# Patient Record
Sex: Female | Born: 1945
Health system: Southern US, Community
[De-identification: ages and names within clinical notes are randomized; demographics above are authoritative.]

## PROBLEM LIST (undated history)

## (undated) DIAGNOSIS — E039 Hypothyroidism, unspecified: Secondary | ICD-10-CM

## (undated) HISTORY — PX: BREAST BIOPSY: SHX20

## (undated) HISTORY — DX: Hypothyroidism, unspecified: E03.9

---

## 1970-06-23 HISTORY — PX: BREAST BIOPSY: SHX20

## 2010-06-23 HISTORY — PX: CATARACT EXTRACTION: SUR2

## 2013-09-08 ENCOUNTER — Ambulatory Visit: Payer: Self-pay | Admitting: Family Medicine

## 2015-05-28 ENCOUNTER — Other Ambulatory Visit: Payer: Self-pay | Admitting: Family Medicine

## 2015-05-28 DIAGNOSIS — Z1231 Encounter for screening mammogram for malignant neoplasm of breast: Secondary | ICD-10-CM

## 2015-06-06 ENCOUNTER — Ambulatory Visit
Admission: RE | Admit: 2015-06-06 | Discharge: 2015-06-06 | Disposition: A | Discharge status: Home / Self Care | Payer: Medicare Other | Source: Ambulatory Visit | Attending: Family Medicine | Admitting: Family Medicine

## 2015-06-06 DIAGNOSIS — Z1231 Encounter for screening mammogram for malignant neoplasm of breast: Secondary | ICD-10-CM | POA: Diagnosis not present

## 2016-06-25 ENCOUNTER — Other Ambulatory Visit: Payer: Self-pay | Admitting: Family Medicine

## 2016-06-25 DIAGNOSIS — Z1231 Encounter for screening mammogram for malignant neoplasm of breast: Secondary | ICD-10-CM

## 2016-07-01 ENCOUNTER — Ambulatory Visit
Admission: RE | Admit: 2016-07-01 | Discharge: 2016-07-01 | Disposition: A | Discharge status: Home / Self Care | Payer: Medicare HMO | Source: Ambulatory Visit | Attending: Family Medicine | Admitting: Family Medicine

## 2016-07-01 DIAGNOSIS — Z1231 Encounter for screening mammogram for malignant neoplasm of breast: Secondary | ICD-10-CM | POA: Insufficient documentation

## 2016-07-26 ENCOUNTER — Ambulatory Visit
Admission: EM | Admit: 2016-07-26 | Discharge: 2016-07-26 | Disposition: A | Discharge status: Home / Self Care | Payer: Medicare HMO | Attending: Family Medicine | Admitting: Family Medicine

## 2016-07-26 ENCOUNTER — Encounter: Payer: Self-pay | Admitting: Gynecology

## 2016-07-26 DIAGNOSIS — J029 Acute pharyngitis, unspecified: Secondary | ICD-10-CM

## 2016-07-26 LAB — RAPID STREP SCREEN (MED CTR MEBANE ONLY): Streptococcus, Group A Screen (Direct): NEGATIVE

## 2016-07-26 MED ORDER — AZITHROMYCIN 250 MG PO TABS: 250.0000 mg | ORAL_TABLET | Freq: Every day | ORAL | 0 refills | Status: DC

## 2016-07-26 NOTE — ED Provider Notes (Signed)
[facility]    CSN: [id number] Arrival date & time: 07/26/16  1011  History   Chief Complaint Chief Complaint  Patient presents with  . Sore Throat  . Fever   HPI  71 year old female presents with complaints of sore throat and fever. Symptoms started Monday. Mild fever. Tmax 100.9. Sore throat is severe per her report. Associated mild cough. No other associated symptoms. She's been using ibuprofen with no improvement. No known exacerbating factors. No other associated symptoms. No other complaints or concerns at this time.  PMH - Hypothyroidism.  Past Surgical History:  Procedure Laterality Date  . BREAST BIOPSY Left    neg   OB History    No data available     Home Medications    Prior to Admission medications   Medication Sig Start Date End Date Taking? Authorizing Provider  levothyroxine (SYNTHROID, LEVOTHROID) 75 MCG tablet Take 75 mcg by mouth daily before breakfast.   Yes Historical Provider, MD  azithromycin (ZITHROMAX) 250 MG tablet Take 1 tablet (250 mg total) by mouth daily. Take first 2 tablets together, then 1 every day until finished. 07/26/16   Tommie SamsJayce G Julicia Krieger, DO   Family History Family History  Problem Relation Age of Onset  . Breast cancer Neg Hx    Social History Social History  Substance Use Topics  . Smoking status: Never Smoker  . Smokeless tobacco: Never Used  . Alcohol use No   Allergies   Patient has no known allergies.  Review of Systems Review of Systems  Constitutional: Positive for fever.  HENT: Positive for sore throat.   Respiratory: Positive for cough.   All other systems reviewed and are negative.  Physical Exam Triage Vital Signs ED Triage Vitals  Enc Vitals Group     BP 07/26/16 1118 99/84     Pulse Rate 07/26/16 1118 77     Resp 07/26/16 1118 16     Temp 07/26/16 1118 99.4 F (37.4 C)     Temp Source 07/26/16 1118 Oral     SpO2 07/26/16 1118 100 %     Weight 07/26/16 1116 165 lb (74.8 kg)     Height 07/26/16  1116 5\' 7"  (1.702 m)     Head Circumference --      Peak Flow --      Pain Score 07/26/16 1120 5     Pain Loc --      Pain Edu? --      Excl. in GC? --    Updated Vital Signs BP 99/84 (BP Location: Left Arm)   Pulse 77   Temp 99.4 F (37.4 C) (Oral)   Resp 16   Ht 5\' 7"  (1.702 m)   Wt 165 lb (74.8 kg)   SpO2 100%   BMI 25.84 kg/m   Physical Exam  Constitutional: She is oriented to person, place, and time. She appears well-developed. No distress.  HENT:  Mouth/Throat: Oropharynx is clear and moist.  Eyes: Conjunctivae are normal.  Neck: Neck supple.  Cardiovascular: Normal rate and regular rhythm.   Pulmonary/Chest: Effort normal and breath sounds normal.  Abdominal: Soft. She exhibits no distension. There is no tenderness.  Musculoskeletal: Normal range of motion.  Lymphadenopathy:    She has no cervical adenopathy.  Neurological: She is alert and oriented to person, place, and time.  Skin: No rash noted.  Psychiatric: She has a normal mood and affect.  Vitals reviewed.  UC Treatments / Results  Labs (all labs ordered are  listed, but only abnormal results are displayed) Labs Reviewed  RAPID STREP SCREEN (NOT AT [facility])  CULTURE, GROUP A STREP [facility])    EKG  EKG Interpretation None      Radiology No results found.  Procedures Procedures (including critical care time)  Medications Ordered in UC Medications - No data to display   Initial Impression / Assessment and Plan / UC Course  I have reviewed the triage vital signs and the nursing notes.  Pertinent labs & imaging results that were available during my care of the patient were reviewed by me and considered in my medical decision making (see chart for details).    71 year old female presents with complaints of sore throat and fever. Rapid strep negative. Appears well. Patient reporting severe sore throat. Rx given for azithromycin to cover atypicals. This is to be filled if she fails to improve or  worsens. She understands iand is in agreement. Advised supportive care.  Final Clinical Impressions(s) / UC Diagnoses   Final diagnoses:  Acute pharyngitis, unspecified etiology   New Prescriptions Discharge Medication List as of 07/26/2016 12:20 PM    START taking these medications   Details  azithromycin (ZITHROMAX) 250 MG tablet Take 1 tablet (250 mg total) by mouth daily. Take first 2 tablets together, then 1 every day until finished., Starting Sat 07/26/2016, Print         Verdis Frederickson Reynolds, DO 07/26/16 1231

## 2016-07-26 NOTE — ED Triage Notes (Signed)
Per patient sore throat x 6 days and fever last night of 100.9

## 2016-07-29 LAB — CULTURE, GROUP A STREP (THRC)

## 2017-07-24 ENCOUNTER — Other Ambulatory Visit: Payer: Self-pay | Admitting: Family Medicine

## 2017-07-24 DIAGNOSIS — Z1231 Encounter for screening mammogram for malignant neoplasm of breast: Secondary | ICD-10-CM

## 2017-07-29 ENCOUNTER — Ambulatory Visit
Admission: RE | Admit: 2017-07-29 | Discharge: 2017-07-29 | Disposition: A | Discharge status: Home / Self Care | Payer: Medicare HMO | Source: Ambulatory Visit | Attending: Family Medicine | Admitting: Family Medicine

## 2017-07-29 DIAGNOSIS — Z1231 Encounter for screening mammogram for malignant neoplasm of breast: Secondary | ICD-10-CM | POA: Diagnosis not present

## 2018-07-30 ENCOUNTER — Other Ambulatory Visit: Payer: Self-pay | Admitting: Family Medicine

## 2018-07-30 DIAGNOSIS — Z1231 Encounter for screening mammogram for malignant neoplasm of breast: Secondary | ICD-10-CM

## 2018-08-11 ENCOUNTER — Encounter (INDEPENDENT_AMBULATORY_CARE_PROVIDER_SITE_OTHER): Payer: Self-pay

## 2018-08-11 ENCOUNTER — Ambulatory Visit
Admission: RE | Admit: 2018-08-11 | Discharge: 2018-08-11 | Disposition: A | Discharge status: Home / Self Care | Payer: Medicare HMO | Source: Ambulatory Visit | Attending: Family Medicine | Admitting: Family Medicine

## 2018-08-11 DIAGNOSIS — Z1231 Encounter for screening mammogram for malignant neoplasm of breast: Secondary | ICD-10-CM | POA: Insufficient documentation

## 2019-07-26 LAB — LIPID PANEL
Cholesterol: 243 — AB (ref 0–200)
HDL: 52 (ref 35–70)
LDL Cholesterol: 151
Triglycerides: 202 — AB (ref 40–160)

## 2019-07-26 LAB — CBC: RBC: 4.7 (ref 3.87–5.11)

## 2019-07-26 LAB — HEPATIC FUNCTION PANEL
ALT: 18 (ref 7–35)
AST: 20 (ref 13–35)
Alkaline Phosphatase: 126 — AB (ref 25–125)
Bilirubin, Total: 0.6

## 2019-07-26 LAB — CBC AND DIFFERENTIAL
HCT: 43 (ref 36–46)
Hemoglobin: 14 (ref 12.0–16.0)
Platelets: 303 (ref 150–399)
WBC: 6.2

## 2019-07-26 LAB — BASIC METABOLIC PANEL
BUN: 14 (ref 4–21)
CO2: 22 (ref 13–22)
Chloride: 107 (ref 99–108)
Creatinine: 1 (ref 0.5–1.1)
Glucose: 89
Potassium: 4 (ref 3.4–5.3)
Sodium: 141 (ref 137–147)

## 2019-07-26 LAB — COMPREHENSIVE METABOLIC PANEL
Albumin: 3.6 (ref 3.5–5.0)
Calcium: 9.3 (ref 8.7–10.7)
GFR calc non Af Amer: 56

## 2019-07-26 LAB — TSH: TSH: 1.44 (ref 0.41–5.90)

## 2019-07-26 LAB — HEMOGLOBIN A1C: Hemoglobin A1C: 5.5

## 2020-06-04 ENCOUNTER — Other Ambulatory Visit: Payer: Self-pay | Admitting: Internal Medicine

## 2020-06-05 ENCOUNTER — Ambulatory Visit: Payer: Medicare HMO | Admitting: Internal Medicine

## 2020-06-05 ENCOUNTER — Encounter: Payer: Self-pay | Admitting: Internal Medicine

## 2020-06-05 ENCOUNTER — Other Ambulatory Visit: Payer: Self-pay

## 2020-06-05 VITALS — BP 138/80 | HR 80 | Temp 98.5°F | Ht 67.0 in | Wt 174.0 lb

## 2020-06-05 DIAGNOSIS — Z1231 Encounter for screening mammogram for malignant neoplasm of breast: Secondary | ICD-10-CM | POA: Diagnosis not present

## 2020-06-05 DIAGNOSIS — E039 Hypothyroidism, unspecified: Secondary | ICD-10-CM | POA: Diagnosis not present

## 2020-06-05 MED ORDER — LEVOTHYROXINE SODIUM 75 MCG PO TABS: 75.0000 ug | ORAL_TABLET | Freq: Every day | ORAL | 1 refills | Status: DC

## 2020-06-05 NOTE — Progress Notes (Signed)
Date:  06/05/2020   Name:  Krista Burns   DOB:  09-Apr-1946   MRN:  [id number]  Previous patient of [facility] [place]. Colonoscopy done in [facility]  03/2011 normal.  Chief Complaint: Establish Care  Thyroid Problem Presents for follow-up visit. Patient reports no anxiety, constipation, depressed mood, diarrhea, fatigue, heat intolerance, leg swelling, palpitations, weight gain or weight loss. The symptoms have been stable.    Lab Results  Component Value Date   CREATININE 1.0 07/26/2019   BUN 14 07/26/2019   NA 141 07/26/2019   K 4.0 07/26/2019   CL 107 07/26/2019   CO2 22 07/26/2019   Lab Results  Component Value Date   CHOL 243 (A) 07/26/2019   HDL 52 07/26/2019   LDLCALC 151 07/26/2019   TRIG 202 (A) 07/26/2019   Lab Results  Component Value Date   TSH 1.44 07/26/2019   Lab Results  Component Value Date   HGBA1C 5.5 07/26/2019   Lab Results  Component Value Date   WBC 6.2 07/26/2019   HGB 14.0 07/26/2019   HCT 43 07/26/2019   PLT 303 07/26/2019   Lab Results  Component Value Date   ALT 18 07/26/2019   AST 20 07/26/2019   ALKPHOS 126 (A) 07/26/2019     Review of Systems  Constitutional: Negative for chills, fatigue, fever, unexpected weight change, weight gain and weight loss.  HENT: Negative for trouble swallowing.   Respiratory: Negative for chest tightness and shortness of breath.   Cardiovascular: Negative for chest pain, palpitations and leg swelling.  Gastrointestinal: Negative for abdominal pain, constipation and diarrhea.  Endocrine: Negative for heat intolerance.  Musculoskeletal: Negative for arthralgias.  Neurological: Negative for dizziness, light-headedness and headaches.  Psychiatric/Behavioral: Negative for dysphoric mood and sleep disturbance. The patient is not nervous/anxious.     Patient Active Problem List   Diagnosis Date Noted  . Hypothyroidism (acquired) 06/05/2020    No Known Allergies  Past Surgical  History:  Procedure Laterality Date  . breast excision Left 1972   clogged milk duct    Social History   Tobacco Use  . Smoking status: Never Smoker  . Smokeless tobacco: Never Used  Vaping Use  . Vaping Use: Never used  Substance Use Topics  . Alcohol use: No  . Drug use: Not Currently     Medication list has been reviewed and updated.  Current Meds  Medication Sig  . levothyroxine (SYNTHROID, LEVOTHROID) 75 MCG tablet Take 75 mcg by mouth daily before breakfast.    PHQ 2/9 Scores 06/05/2020  PHQ - 2 Score 0  PHQ- 9 Score 0    GAD 7 : Generalized Anxiety Score 06/05/2020  Nervous, Anxious, on Edge 0  Control/stop worrying 0  Worry too much - different things 0  Trouble relaxing 0  Restless 0  Easily annoyed or irritable 0  Afraid - awful might happen 0  Total GAD 7 Score 0    BP Readings from Last 3 Encounters:  06/05/20 138/80  07/26/16 99/84    Physical Exam Vitals and nursing note reviewed.  Constitutional:      General: She is not in acute distress.    Appearance: Normal appearance. She is well-developed.  HENT:     Head: Normocephalic and atraumatic.  Neck:     Vascular: No carotid bruit.  Cardiovascular:     Rate and Rhythm: Normal rate and regular rhythm.     Pulses: Normal pulses.     Heart  sounds: No murmur heard.   Pulmonary:     Effort: Pulmonary effort is normal. No respiratory distress.     Breath sounds: No wheezing or rhonchi.  Musculoskeletal:        General: Normal range of motion.     Cervical back: Normal range of motion.     Right lower leg: No edema.     Left lower leg: No edema.  Lymphadenopathy:     Cervical: No cervical adenopathy.  Skin:    General: Skin is warm and dry.     Findings: No rash.  Neurological:     General: No focal deficit present.     Mental Status: She is alert and oriented to person, place, and time.  Psychiatric:        Mood and Affect: Mood and affect and mood normal.     Wt Readings from  Last 3 Encounters:  06/05/20 174 lb (78.9 kg)  07/26/16 165 lb (74.8 kg)    BP 138/80   Pulse 80   Temp 98.5 F (36.9 C) (Oral)   Ht 5\' 7"  (1.702 m)   Wt 174 lb (78.9 kg)   SpO2 99%   BMI 27.25 kg/m   Assessment and Plan: 1. Hypothyroidism (acquired) Supplemented; last TSH normal Patient is without complaints today Continue current regimen - levothyroxine (SYNTHROID) 75 MCG tablet; Take 1 tablet (75 mcg total) by mouth daily before breakfast.  Dispense: 90 tablet; Refill: 1  2. Encounter for screening mammogram for breast cancer Schedule at [facility] - MM 3D SCREEN BREAST BILATERAL; Future   Partially dictated using [facility]. Any errors are unintentional.  Animal nutritionist, MD [facility]  06/05/2020

## 2020-07-16 DIAGNOSIS — Z961 Presence of intraocular lens: Secondary | ICD-10-CM | POA: Diagnosis not present

## 2020-07-16 DIAGNOSIS — H43813 Vitreous degeneration, bilateral: Secondary | ICD-10-CM | POA: Diagnosis not present

## 2020-07-16 DIAGNOSIS — H25811 Combined forms of age-related cataract, right eye: Secondary | ICD-10-CM | POA: Diagnosis not present

## 2020-08-09 ENCOUNTER — Telehealth: Payer: Self-pay | Admitting: Internal Medicine

## 2020-08-09 NOTE — Telephone Encounter (Signed)
Left message for patient to call back and schedule Medicare Annual Wellness Visit (AWV) either virtually or in office. Whichever the patients preference is.  No history of AWV; please schedule at anytime with [facility] Health Advisor.  This should be a 40 minute visit  AWV-I PER [facility] AS OF 07/25/2011

## 2020-08-15 ENCOUNTER — Ambulatory Visit
Admission: RE | Admit: 2020-08-15 | Discharge: 2020-08-15 | Disposition: A | Discharge status: Home / Self Care | Payer: Medicare HMO | Source: Ambulatory Visit | Attending: Internal Medicine | Admitting: Internal Medicine

## 2020-08-15 ENCOUNTER — Other Ambulatory Visit: Payer: Self-pay

## 2020-08-15 DIAGNOSIS — Z1231 Encounter for screening mammogram for malignant neoplasm of breast: Secondary | ICD-10-CM | POA: Insufficient documentation

## 2020-09-12 ENCOUNTER — Ambulatory Visit (INDEPENDENT_AMBULATORY_CARE_PROVIDER_SITE_OTHER): Payer: Medicare HMO | Admitting: Internal Medicine

## 2020-09-12 ENCOUNTER — Encounter: Payer: Self-pay | Admitting: Internal Medicine

## 2020-09-12 ENCOUNTER — Other Ambulatory Visit: Payer: Self-pay

## 2020-09-12 VITALS — BP 138/74 | HR 77 | Temp 97.9°F | Ht 67.0 in | Wt 172.0 lb

## 2020-09-12 DIAGNOSIS — H25091 Other age-related incipient cataract, right eye: Secondary | ICD-10-CM

## 2020-09-12 DIAGNOSIS — Z1159 Encounter for screening for other viral diseases: Secondary | ICD-10-CM

## 2020-09-12 DIAGNOSIS — Z1211 Encounter for screening for malignant neoplasm of colon: Secondary | ICD-10-CM | POA: Diagnosis not present

## 2020-09-12 DIAGNOSIS — E2839 Other primary ovarian failure: Secondary | ICD-10-CM

## 2020-09-12 DIAGNOSIS — H9313 Tinnitus, bilateral: Secondary | ICD-10-CM | POA: Diagnosis not present

## 2020-09-12 DIAGNOSIS — E039 Hypothyroidism, unspecified: Secondary | ICD-10-CM | POA: Diagnosis not present

## 2020-09-12 DIAGNOSIS — Z0000 Encounter for general adult medical examination without abnormal findings: Secondary | ICD-10-CM | POA: Diagnosis not present

## 2020-09-12 NOTE — Progress Notes (Signed)
Date:  09/12/2020   Name:  Krista Burns   DOB:  [birth year]   MRN:  [id number]   Chief Complaint: Annual Exam (Breast exam no pap )  Krista Burns is a 75 y.o. female who presents today for her Complete Annual Exam. She feels well. She reports exercising walking X5-6 days a week. She reports she is sleeping well. Breast complaints none.  She has had itching of palms and soles since her covid booster in November - it is gradually improving.  Mammogram: 07/2020 DEXA: 2012 osteopenia Pap smear: aged out Colonoscopy: 03/2011 - due for repeat  Immunization History  Administered Date(s) Administered  . Influenza, High Dose Seasonal PF 07/11/2018  . PFIZER(Purple Top)SARS-COV-2 Vaccination 08/02/2019, 08/23/2019, 04/27/2020  . Pneumococcal Conjugate-13 11/20/2017  . Pneumococcal Polysaccharide-23 04/24/2011    Thyroid Problem Presents for follow-up visit. Patient reports no anxiety, cold intolerance, constipation, diaphoresis, diarrhea, dry skin, fatigue, hair loss, leg swelling, palpitations, tremors or weight loss. The symptoms have been stable.    Lab Results  Component Value Date   CREATININE 1.0 07/26/2019   BUN 14 07/26/2019   NA 141 07/26/2019   K 4.0 07/26/2019   CL 107 07/26/2019   CO2 22 07/26/2019   Lab Results  Component Value Date   CHOL 243 (A) 07/26/2019   HDL 52 07/26/2019   LDLCALC 151 07/26/2019   TRIG 202 (A) 07/26/2019   Lab Results  Component Value Date   TSH 1.44 07/26/2019   Lab Results  Component Value Date   HGBA1C 5.5 07/26/2019   Lab Results  Component Value Date   WBC 6.2 07/26/2019   HGB 14.0 07/26/2019   HCT 43 07/26/2019   PLT 303 07/26/2019   Lab Results  Component Value Date   ALT 18 07/26/2019   AST 20 07/26/2019   ALKPHOS 126 (A) 07/26/2019     Review of Systems  Constitutional: Negative for chills, diaphoresis, fatigue, fever and weight loss.  HENT: Positive for tinnitus (mild, chronic). Negative for congestion,  hearing loss, trouble swallowing and voice change.   Eyes: Positive for visual disturbance (has right cataract that needs surgery).  Respiratory: Negative for cough, chest tightness, shortness of breath and wheezing.   Cardiovascular: Negative for chest pain, palpitations and leg swelling.  Gastrointestinal: Negative for abdominal pain, constipation, diarrhea and vomiting.  Endocrine: Negative for cold intolerance, polydipsia and polyuria.  Genitourinary: Negative for dysuria, frequency, genital sores, vaginal bleeding and vaginal discharge.  Musculoskeletal: Negative for arthralgias, gait problem and joint swelling.  Skin: Negative for color change and rash.       Itching palms and soles  Neurological: Negative for dizziness, tremors, light-headedness and headaches.  Hematological: Negative for adenopathy. Does not bruise/bleed easily.  Psychiatric/Behavioral: Negative for dysphoric mood and sleep disturbance. The patient is not nervous/anxious.     Patient Active Problem List   Diagnosis Date Noted  . Hypothyroidism (acquired) 06/05/2020    No Known Allergies  Past Surgical History:  Procedure Laterality Date  . BREAST BIOPSY Left 1972   clogged milk duct-neg  . breast excision Left 1972   clogged milk duct    Social History   Tobacco Use  . Smoking status: Never Smoker  . Smokeless tobacco: Never Used  Vaping Use  . Vaping Use: Never used  Substance Use Topics  . Alcohol use: No  . Drug use: Not Currently     Medication list has been reviewed and updated.  Current Meds  Medication  Sig  . levothyroxine (SYNTHROID) 75 MCG tablet Take 1 tablet (75 mcg total) by mouth daily before breakfast.    PHQ 2/9 Scores 09/12/2020 06/05/2020  PHQ - 2 Score 0 0  PHQ- 9 Score 0 0    GAD 7 : Generalized Anxiety Score 09/12/2020 06/05/2020  Nervous, Anxious, on Edge 0 0  Control/stop worrying 0 0  Worry too much - different things 0 0  Trouble relaxing 0 0  Restless 0 0   Easily annoyed or irritable 0 0  Afraid - awful might happen 0 0  Total GAD 7 Score 0 0    BP Readings from Last 3 Encounters:  09/12/20 138/74  06/05/20 138/80  07/26/16 99/84    Physical Exam Vitals and nursing note reviewed.  Constitutional:      General: She is not in acute distress.    Appearance: She is well-developed.  HENT:     Head: Normocephalic and atraumatic.     Right Ear: Tympanic membrane and ear canal normal.     Left Ear: Tympanic membrane and ear canal normal.     Nose:     Right Sinus: No maxillary sinus tenderness.     Left Sinus: No maxillary sinus tenderness.  Eyes:     General: No scleral icterus.       Right eye: No discharge.        Left eye: No discharge.     Conjunctiva/sclera: Conjunctivae normal.  Neck:     Thyroid: No thyromegaly.     Vascular: No carotid bruit.  Cardiovascular:     Rate and Rhythm: Normal rate and regular rhythm.     Pulses: Normal pulses.     Heart sounds: Normal heart sounds.  Pulmonary:     Effort: Pulmonary effort is normal. No respiratory distress.     Breath sounds: No wheezing.  Abdominal:     General: Bowel sounds are normal.     Palpations: Abdomen is soft.     Tenderness: There is no abdominal tenderness.  Musculoskeletal:     Cervical back: Normal range of motion. No erythema.     Right lower leg: No edema.     Left lower leg: No edema.  Lymphadenopathy:     Cervical: No cervical adenopathy.  Skin:    General: Skin is warm and dry.     Findings: No erythema, petechiae or rash.  Neurological:     Mental Status: She is alert and oriented to person, place, and time.     Cranial Nerves: No cranial nerve deficit.     Sensory: No sensory deficit.     Deep Tendon Reflexes: Reflexes are normal and symmetric.  Psychiatric:        Attention and Perception: Attention normal.        Mood and Affect: Mood normal.     Wt Readings from Last 3 Encounters:  09/12/20 172 lb (78 kg)  06/05/20 174 lb (78.9 kg)   07/26/16 165 lb (74.8 kg)    BP 138/74   Pulse 77   Temp 97.9 F (36.6 C) (Oral)   Ht 5\' 7"  (1.702 m)   Wt 172 lb (78 kg)   SpO2 99%   BMI 26.94 kg/m   Assessment and Plan: 1. Annual physical exam Normal exam; continue healthy diet, exercise - CBC with Differential/Platelet - Comprehensive metabolic panel - Lipid panel  2. Colon cancer screening Refer next year for  10 yr follow up  3. Need for hepatitis C  screening test - Hepatitis C antibody  4. Hypothyroidism (acquired) supplemented - TSH + free T4  5. Ovarian failure Due for DEXA - DG Bone Density; Future  6. Tinnitus aurium, bilateral Without hearing loss  7. Other age-related incipient cataract of right eye Surgery upcoming at [facility] She may need to call for a referral per [facility] - she will let us know   Partially dictated using Animal nutritionist. Any errors are unintentional.  Bari Edward, MD [facility]  09/12/2020

## 2020-09-13 ENCOUNTER — Encounter: Payer: Self-pay | Admitting: Internal Medicine

## 2020-09-13 DIAGNOSIS — E782 Mixed hyperlipidemia: Secondary | ICD-10-CM | POA: Insufficient documentation

## 2020-09-13 LAB — CBC WITH DIFFERENTIAL/PLATELET
Basophils Absolute: 0 10*3/uL (ref 0.0–0.2)
Basos: 1 %
EOS (ABSOLUTE): 0.1 10*3/uL (ref 0.0–0.4)
Eos: 2 %
Hematocrit: 42.3 % (ref 34.0–46.6)
Hemoglobin: 14.5 g/dL (ref 11.1–15.9)
Immature Grans (Abs): 0 10*3/uL (ref 0.0–0.1)
Immature Granulocytes: 0 %
Lymphocytes Absolute: 2.8 10*3/uL (ref 0.7–3.1)
Lymphs: 49 %
MCH: 30.2 pg (ref 26.6–33.0)
MCHC: 34.3 g/dL (ref 31.5–35.7)
MCV: 88 fL (ref 79–97)
Monocytes Absolute: 0.4 10*3/uL (ref 0.1–0.9)
Monocytes: 8 %
Neutrophils Absolute: 2.3 10*3/uL (ref 1.4–7.0)
Neutrophils: 40 %
Platelets: 292 10*3/uL (ref 150–450)
RBC: 4.8 x10E6/uL (ref 3.77–5.28)
RDW: 12.9 % (ref 11.7–15.4)
WBC: 5.6 10*3/uL (ref 3.4–10.8)

## 2020-09-13 LAB — LIPID PANEL
Chol/HDL Ratio: 4.2 ratio (ref 0.0–4.4)
Cholesterol, Total: 227 mg/dL — ABNORMAL HIGH (ref 100–199)
HDL: 54 mg/dL (ref >39)
LDL Chol Calc (NIH): 145 mg/dL — ABNORMAL HIGH (ref 0–99)
Triglycerides: 156 mg/dL — ABNORMAL HIGH (ref 0–149)
VLDL Cholesterol Cal: 28 mg/dL (ref 5–40)

## 2020-09-13 LAB — HEPATITIS C ANTIBODY: Hep C Virus Ab: 0.1 s/co ratio (ref 0.0–0.9)

## 2020-09-13 LAB — COMPREHENSIVE METABOLIC PANEL
ALT: 15 IU/L (ref 0–32)
AST: 20 IU/L (ref 0–40)
Albumin/Globulin Ratio: 1.5 (ref 1.2–2.2)
Albumin: 4.3 g/dL (ref 3.7–4.7)
Alkaline Phosphatase: 146 IU/L — ABNORMAL HIGH (ref 44–121)
BUN/Creatinine Ratio: 11 — ABNORMAL LOW (ref 12–28)
BUN: 12 mg/dL (ref 8–27)
Bilirubin Total: 0.6 mg/dL (ref 0.0–1.2)
CO2: 19 mmol/L — ABNORMAL LOW (ref 20–29)
Calcium: 9.7 mg/dL (ref 8.7–10.3)
Chloride: 106 mmol/L (ref 96–106)
Creatinine, Ser: 1.1 mg/dL — ABNORMAL HIGH (ref 0.57–1.00)
Globulin, Total: 2.9 g/dL (ref 1.5–4.5)
Glucose: 91 mg/dL (ref 65–99)
Potassium: 5 mmol/L (ref 3.5–5.2)
Sodium: 142 mmol/L (ref 134–144)
Total Protein: 7.2 g/dL (ref 6.0–8.5)
eGFR: 53 mL/min/{1.73_m2} — ABNORMAL LOW (ref >59)

## 2020-09-13 LAB — TSH+FREE T4
Free T4: 1.56 ng/dL (ref 0.82–1.77)
TSH: 0.623 u[IU]/mL (ref 0.450–4.500)

## 2020-09-25 ENCOUNTER — Other Ambulatory Visit: Payer: Self-pay

## 2020-09-25 ENCOUNTER — Ambulatory Visit
Admission: RE | Admit: 2020-09-25 | Discharge: 2020-09-25 | Disposition: A | Discharge status: Home / Self Care | Payer: Medicare HMO | Source: Ambulatory Visit | Attending: Internal Medicine | Admitting: Internal Medicine

## 2020-09-25 DIAGNOSIS — E2839 Other primary ovarian failure: Secondary | ICD-10-CM | POA: Insufficient documentation

## 2020-09-25 DIAGNOSIS — M8588 Other specified disorders of bone density and structure, other site: Secondary | ICD-10-CM | POA: Diagnosis not present

## 2020-09-25 DIAGNOSIS — M810 Age-related osteoporosis without current pathological fracture: Secondary | ICD-10-CM | POA: Diagnosis not present

## 2020-09-26 DIAGNOSIS — H25811 Combined forms of age-related cataract, right eye: Secondary | ICD-10-CM | POA: Diagnosis not present

## 2020-09-28 ENCOUNTER — Encounter: Payer: Self-pay | Admitting: Internal Medicine

## 2020-09-28 DIAGNOSIS — M810 Age-related osteoporosis without current pathological fracture: Secondary | ICD-10-CM | POA: Insufficient documentation

## 2020-10-18 DIAGNOSIS — H52221 Regular astigmatism, right eye: Secondary | ICD-10-CM | POA: Diagnosis not present

## 2020-10-18 DIAGNOSIS — H25811 Combined forms of age-related cataract, right eye: Secondary | ICD-10-CM | POA: Diagnosis not present

## 2020-11-30 ENCOUNTER — Encounter: Payer: Self-pay | Admitting: Internal Medicine

## 2020-12-31 ENCOUNTER — Ambulatory Visit: Payer: Medicare HMO

## 2021-01-08 ENCOUNTER — Other Ambulatory Visit: Payer: Self-pay | Admitting: Internal Medicine

## 2021-01-08 DIAGNOSIS — E039 Hypothyroidism, unspecified: Secondary | ICD-10-CM

## 2021-01-08 NOTE — Telephone Encounter (Signed)
Requested Prescriptions  Pending Prescriptions Disp Refills  . levothyroxine (SYNTHROID) 75 MCG tablet [Pharmacy Med Name: Levothyroxine Sodium 75 MCG Oral Tablet] 90 tablet 2    Sig: TAKE 1 TABLET BY MOUTH ONCE DAILY BEFORE  BREAKFAST     Endocrinology:  Hypothyroid Agents Failed - 01/08/2021  5:46 AM      Failed - TSH needs to be rechecked within 3 months after an abnormal result. Refill until TSH is due.      Passed - TSH in normal range and within 360 days    TSH  Date Value Ref Range Status  09/12/2020 0.623 0.450 - 4.500 uIU/mL Final         Passed - Valid encounter within last 12 months    Recent Outpatient Visits          3 months ago Annual physical exam   [facility] Reubin Milan, MD   7 months ago Hypothyroidism (acquired)   [facility] Reubin Milan, MD      Future Appointments            In 8 months Judithann Graves Nyoka Cowden, MD [facility]

## 2021-03-04 ENCOUNTER — Telehealth: Payer: Self-pay | Admitting: Internal Medicine

## 2021-03-04 NOTE — Telephone Encounter (Signed)
Copied from CRM [id number]. Topic: Medicare AWV >> Mar 04, 2021  4:42 PM Claudette Laws R wrote: Reason for CRM:  Left message for patient to call back and schedule Medicare Annual Wellness Visit (AWV) in office.   If unable to come into the office for AWV,  please offer to do virtually or by telephone.  No hx of AWV eligible for AWVI as of 07/25/2011  Please schedule at anytime with [facility] Health Advisor.      40 Minutes appointment   Any questions, please call me at [phone]

## 2021-04-16 ENCOUNTER — Telehealth: Payer: Self-pay | Admitting: Internal Medicine

## 2021-04-16 NOTE — Telephone Encounter (Signed)
Copied from CRM [id number]. Topic: Medicare AWV >> Apr 16, 2021  1:21 PM Claudette Laws R wrote: Reason for CRM:  Left message for patient to call back and schedule Medicare Annual Wellness Visit (AWV) in office.   If unable to come into the office for AWV,  please offer to do virtually or by telephone.  No hx of AWV eligible for AWVI as of 07/25/2011 per [facility]  Please schedule at anytime with [facility] Health Advisor.      40 Minutes appointment   Any questions, please call me at [phone]

## 2021-05-28 DIAGNOSIS — Z961 Presence of intraocular lens: Secondary | ICD-10-CM | POA: Diagnosis not present

## 2021-05-28 DIAGNOSIS — H43813 Vitreous degeneration, bilateral: Secondary | ICD-10-CM | POA: Diagnosis not present

## 2021-07-15 ENCOUNTER — Telehealth: Payer: Self-pay | Admitting: Internal Medicine

## 2021-07-15 NOTE — Telephone Encounter (Signed)
Copied from CRM [id number]. Topic: Medicare AWV >> Jul 15, 2021 11:50 AM Claudette Laws R wrote: Reason for CRM:  Left message for patient to call back and schedule Medicare Annual Wellness Visit (AWV) in office.   If unable to come into the office for AWV,  please offer to do virtually or by telephone.  No hx of AWV eligible for AWVI as of 07/25/2011 per [facility]  Please schedule at anytime with [facility] Health Advisor.      40 Minutes appointment   Any questions, please call me at [phone]

## 2021-07-30 ENCOUNTER — Other Ambulatory Visit: Payer: Self-pay | Admitting: Internal Medicine

## 2021-07-30 DIAGNOSIS — Z1231 Encounter for screening mammogram for malignant neoplasm of breast: Secondary | ICD-10-CM

## 2021-09-13 ENCOUNTER — Encounter: Payer: Self-pay | Admitting: Internal Medicine

## 2021-09-13 ENCOUNTER — Telehealth: Payer: Self-pay

## 2021-09-13 ENCOUNTER — Other Ambulatory Visit: Payer: Self-pay

## 2021-09-13 ENCOUNTER — Ambulatory Visit (INDEPENDENT_AMBULATORY_CARE_PROVIDER_SITE_OTHER): Payer: No Typology Code available for payment source | Admitting: Internal Medicine

## 2021-09-13 VITALS — BP 124/70 | HR 74 | Ht 67.0 in | Wt 141.8 lb

## 2021-09-13 DIAGNOSIS — E782 Mixed hyperlipidemia: Secondary | ICD-10-CM

## 2021-09-13 DIAGNOSIS — Z1211 Encounter for screening for malignant neoplasm of colon: Secondary | ICD-10-CM

## 2021-09-13 DIAGNOSIS — E039 Hypothyroidism, unspecified: Secondary | ICD-10-CM

## 2021-09-13 DIAGNOSIS — Z0000 Encounter for general adult medical examination without abnormal findings: Secondary | ICD-10-CM

## 2021-09-13 NOTE — Progress Notes (Signed)
? ? ?Date:  09/13/2021  ? ?Name:  Krista Burns   DOB:  1945-07-22   MRN:  [id number] ? ? ?Chief Complaint: Annual Exam (Breast Exam. No pap.) ?Krista Burns is a 76 y.o. female who presents today for her Complete Annual Exam. She feels well. She reports exercising/ walking. She reports she is sleeping well. Breast complaints - none.  She has lost 30 lbs over the past year with significant diet change and exercise. ? ?Mammogram: 07/2020 scheduled 09/2021 ?DEXA: 09/2020 osteoporosis hip ?Colonoscopy: 03/2011, Ordered GI referral Today ? ?Health Maintenance Due  ?Topic Date Due  ? TETANUS/TDAP  Never done  ? Zoster Vaccines- Shingrix (1 of 2) Never done  ? COLONOSCOPY (Pts 45-53yr Insurance coverage will need to be confirmed)  04/06/2021  ? MAMMOGRAM  08/15/2021  ?  ?Immunization History  ?Administered Date(s) Administered  ? Influenza, High Dose Seasonal PF 07/11/2018  ? PFIZER(Purple Top)SARS-COV-2 Vaccination 08/02/2019, 08/23/2019, 04/27/2020  ? Pneumococcal Conjugate-13 11/20/2017  ? Pneumococcal Polysaccharide-23 04/24/2011  ? ? ?Thyroid Problem ?Presents for follow-up visit. Patient reports no anxiety, constipation, diarrhea, fatigue, palpitations or tremors. The symptoms have been stable.  ? ?Lab Results  ?Component Value Date  ? NA 142 09/12/2020  ? K 5.0 09/12/2020  ? CO2 19 (L) 09/12/2020  ? GLUCOSE 91 09/12/2020  ? BUN 12 09/12/2020  ? CREATININE 1.10 (H) 09/12/2020  ? CALCIUM 9.7 09/12/2020  ? EGFR 53 (L) 09/12/2020  ? GFRNONAA 56 07/26/2019  ? ?Lab Results  ?Component Value Date  ? CHOL 227 (H) 09/12/2020  ? HDL 54 09/12/2020  ? LDLCALC 145 (H) 09/12/2020  ? TRIG 156 (H) 09/12/2020  ? CHOLHDL 4.2 09/12/2020  ? ?Lab Results  ?Component Value Date  ? TSH 0.623 09/12/2020  ? ?Lab Results  ?Component Value Date  ? HGBA1C 5.5 07/26/2019  ? ?Lab Results  ?Component Value Date  ? WBC 5.6 09/12/2020  ? HGB 14.5 09/12/2020  ? HCT 42.3 09/12/2020  ? MCV 88 09/12/2020  ? PLT 292 09/12/2020  ? ?Lab Results   ?Component Value Date  ? ALT 15 09/12/2020  ? AST 20 09/12/2020  ? ALKPHOS 146 (H) 09/12/2020  ? BILITOT 0.6 09/12/2020  ? ?No results found for: 25OHVITD2, 2Greene VD25OH  ? ?Review of Systems  ?Constitutional:  Negative for chills, fatigue and fever.  ?HENT:  Negative for congestion, hearing loss, tinnitus, trouble swallowing and voice change.   ?Eyes:  Negative for visual disturbance.  ?Respiratory:  Negative for cough, chest tightness, shortness of breath and wheezing.   ?Cardiovascular:  Negative for chest pain, palpitations and leg swelling.  ?Gastrointestinal:  Negative for abdominal pain, constipation, diarrhea and vomiting.  ?Endocrine: Negative for polydipsia and polyuria.  ?Genitourinary:  Negative for dysuria, frequency, genital sores, vaginal bleeding and vaginal discharge.  ?Musculoskeletal:  Negative for arthralgias, gait problem and joint swelling.  ?Skin:  Negative for color change and rash.  ?Neurological:  Negative for dizziness, tremors, light-headedness and headaches.  ?Hematological:  Negative for adenopathy. Does not bruise/bleed easily.  ?Psychiatric/Behavioral:  Negative for dysphoric mood and sleep disturbance. The patient is not nervous/anxious.   ? ?Patient Active Problem List  ? Diagnosis Date Noted  ? Osteoporosis 09/28/2020  ? Mixed hyperlipidemia 09/13/2020  ? Tinnitus aurium, bilateral 09/12/2020  ? Hypothyroidism (acquired) 06/05/2020  ? ? ?No Known Allergies ? ?Past Surgical History:  ?Procedure Laterality Date  ? BREAST BIOPSY Left 1972  ? clogged milk duct-neg  ? CATARACT EXTRACTION Left 2012  ? ? ?  Social History  ? ?Tobacco Use  ? Smoking status: Never  ? Smokeless tobacco: Never  ?Vaping Use  ? Vaping Use: Never used  ?Substance Use Topics  ? Alcohol use: No  ? Drug use: Not Currently  ? ? ? ?Medication list has been reviewed and updated. ? ?Current Meds  ?Medication Sig  ? levothyroxine (SYNTHROID) 75 MCG tablet TAKE 1 TABLET BY MOUTH ONCE DAILY BEFORE  BREAKFAST  ? ? ? ?   09/13/2021  ?  9:20 AM 09/12/2020  ?  9:10 AM 06/05/2020  ?  2:52 PM  ?PHQ 2/9 Scores  ?PHQ - 2 Score 0 0 0  ?PHQ- 9 Score 0 0 0  ? ? ? ?  09/13/2021  ?  9:20 AM 09/12/2020  ?  9:10 AM 06/05/2020  ?  2:52 PM  ?GAD 7 : Generalized Anxiety Score  ?Nervous, Anxious, on Edge 0 0 0  ?Control/stop worrying 0 0 0  ?Worry too much - different things 0 0 0  ?Trouble relaxing 0 0 0  ?Restless 0 0 0  ?Easily annoyed or irritable 0 0 0  ?Afraid - awful might happen 0 0 0  ?Total GAD 7 Score 0 0 0  ?Anxiety Difficulty Not difficult at all    ? ? ?BP Readings from Last 3 Encounters:  ?09/13/21 124/70  ?09/12/20 138/74  ?06/05/20 138/80  ? ? ?Physical Exam ?Vitals and nursing note reviewed.  ?Constitutional:   ?   General: She is not in acute distress. ?   Appearance: She is well-developed.  ?HENT:  ?   Head: Normocephalic and atraumatic.  ?   Right Ear: Tympanic membrane and ear canal normal.  ?   Left Ear: Tympanic membrane and ear canal normal.  ?   Nose:  ?   Right Sinus: No maxillary sinus tenderness.  ?   Left Sinus: No maxillary sinus tenderness.  ?Eyes:  ?   General: No scleral icterus.    ?   Right eye: No discharge.     ?   Left eye: No discharge.  ?   Conjunctiva/sclera: Conjunctivae normal.  ?Neck:  ?   Thyroid: No thyromegaly.  ?   Vascular: No carotid bruit.  ?Cardiovascular:  ?   Rate and Rhythm: Normal rate and regular rhythm.  ?   Pulses: Normal pulses.  ?   Heart sounds: Normal heart sounds.  ?Pulmonary:  ?   Effort: Pulmonary effort is normal. No respiratory distress.  ?   Breath sounds: No wheezing.  ?Abdominal:  ?   General: Bowel sounds are normal.  ?   Palpations: Abdomen is soft.  ?   Tenderness: There is no abdominal tenderness.  ?Musculoskeletal:  ?   Cervical back: Normal range of motion. No erythema.  ?   Right lower leg: No edema.  ?   Left lower leg: No edema.  ?Lymphadenopathy:  ?   Cervical: No cervical adenopathy.  ?Skin: ?   General: Skin is warm and dry.  ?   Capillary Refill: Capillary refill takes  less than 2 seconds.  ?   Findings: No rash.  ?Neurological:  ?   General: No focal deficit present.  ?   Mental Status: She is alert and oriented to person, place, and time.  ?   Cranial Nerves: No cranial nerve deficit.  ?   Sensory: No sensory deficit.  ?   Deep Tendon Reflexes: Reflexes are normal and symmetric.  ?Psychiatric:     ?  Attention and Perception: Attention normal.     ?   Mood and Affect: Mood normal.  ? ? ?Wt Readings from Last 3 Encounters:  ?09/13/21 141 lb 12.8 oz (64.3 kg)  ?09/12/20 172 lb (78 kg)  ?06/05/20 174 lb (78.9 kg)  ? ? ?BP 124/70   Pulse 74   Ht _0  (1.702 m)   Wt 141 lb 12.8 oz (64.3 kg)   SpO2 100%   BMI 22.21 kg/m?  ? ?Assessment and Plan: ?1. Annual physical exam ?Normal exam.  Excellent weight loss results with diet and exercise. ?Up to date on screenings and immunizations. ?- CBC with Differential/Platelet ? ?2. Colon cancer screening ?Refer to GI ?- Ambulatory referral to Gastroenterology ? ?3. Hypothyroidism (acquired) ?supplemented ?- TSH + free T4 ? ?4. Mixed hyperlipidemia ?Check labs and advise ?- Comprehensive metabolic panel ?- Lipid panel ? ? ?Partially dictated using Editor, commissioning. Any errors are unintentional. ? ?Halina Maidens, MD ?[facility] ?[facility] ? ?09/13/2021 ? ? ? ? ?

## 2021-09-13 NOTE — Telephone Encounter (Signed)
CALLED PATIENT NO ANSWER LEFT VOICEMAIL FOR A CALL BACK ? ?

## 2021-09-14 LAB — TSH+FREE T4
Free T4: 1.57 ng/dL (ref 0.82–1.77)
TSH: 0.181 u[IU]/mL — ABNORMAL LOW (ref 0.450–4.500)

## 2021-09-14 LAB — CBC WITH DIFFERENTIAL/PLATELET
Basophils Absolute: 0.1 10*3/uL (ref 0.0–0.2)
Basos: 1 %
EOS (ABSOLUTE): 0.1 10*3/uL (ref 0.0–0.4)
Eos: 2 %
Hematocrit: 42 % (ref 34.0–46.6)
Hemoglobin: 13.8 g/dL (ref 11.1–15.9)
Immature Grans (Abs): 0 10*3/uL (ref 0.0–0.1)
Immature Granulocytes: 0 %
Lymphocytes Absolute: 3.1 10*3/uL (ref 0.7–3.1)
Lymphs: 47 %
MCH: 29.3 pg (ref 26.6–33.0)
MCHC: 32.9 g/dL (ref 31.5–35.7)
MCV: 89 fL (ref 79–97)
Monocytes Absolute: 0.6 10*3/uL (ref 0.1–0.9)
Monocytes: 9 %
Neutrophils Absolute: 2.6 10*3/uL (ref 1.4–7.0)
Neutrophils: 41 %
Platelets: 295 10*3/uL (ref 150–450)
RBC: 4.71 x10E6/uL (ref 3.77–5.28)
RDW: 12.4 % (ref 11.7–15.4)
WBC: 6.5 10*3/uL (ref 3.4–10.8)

## 2021-09-14 LAB — COMPREHENSIVE METABOLIC PANEL
ALT: 11 IU/L (ref 0–32)
AST: 16 IU/L (ref 0–40)
Albumin/Globulin Ratio: 1.5 (ref 1.2–2.2)
Albumin: 4.2 g/dL (ref 3.7–4.7)
Alkaline Phosphatase: 127 IU/L — ABNORMAL HIGH (ref 44–121)
BUN/Creatinine Ratio: 18 (ref 12–28)
BUN: 17 mg/dL (ref 8–27)
Bilirubin Total: 0.4 mg/dL (ref 0.0–1.2)
CO2: 19 mmol/L — ABNORMAL LOW (ref 20–29)
Calcium: 9.7 mg/dL (ref 8.7–10.3)
Chloride: 105 mmol/L (ref 96–106)
Creatinine, Ser: 0.97 mg/dL (ref 0.57–1.00)
Globulin, Total: 2.8 g/dL (ref 1.5–4.5)
Glucose: 90 mg/dL (ref 70–99)
Potassium: 4.5 mmol/L (ref 3.5–5.2)
Sodium: 140 mmol/L (ref 134–144)
Total Protein: 7 g/dL (ref 6.0–8.5)
eGFR: 61 mL/min/{1.73_m2} (ref >59)

## 2021-09-14 LAB — LIPID PANEL
Chol/HDL Ratio: 3.5 ratio (ref 0.0–4.4)
Cholesterol, Total: 225 mg/dL — ABNORMAL HIGH (ref 100–199)
HDL: 64 mg/dL (ref >39)
LDL Chol Calc (NIH): 132 mg/dL — ABNORMAL HIGH (ref 0–99)
Triglycerides: 164 mg/dL — ABNORMAL HIGH (ref 0–149)
VLDL Cholesterol Cal: 29 mg/dL (ref 5–40)

## 2021-09-16 ENCOUNTER — Other Ambulatory Visit: Payer: Self-pay

## 2021-09-16 DIAGNOSIS — E039 Hypothyroidism, unspecified: Secondary | ICD-10-CM

## 2021-09-16 DIAGNOSIS — Z1211 Encounter for screening for malignant neoplasm of colon: Secondary | ICD-10-CM

## 2021-09-16 MED ORDER — NA SULFATE-K SULFATE-MG SULF 17.5-3.13-1.6 GM/177ML PO SOLN: 1.0000 | Freq: Once | ORAL | 0 refills | Status: AC

## 2021-09-16 MED ORDER — LEVOTHYROXINE SODIUM 75 MCG PO TABS: 75.0000 ug | ORAL_TABLET | Freq: Every day | ORAL | 1 refills | Status: DC

## 2021-09-16 NOTE — Progress Notes (Signed)
Gastroenterology Pre-Procedure Review ? ?Request Date: 10/07/2021 ?Requesting Physician: Dr. Servando Snare ? ?PATIENT REVIEW QUESTIONS: The patient responded to the following health history questions as indicated:   ? ?1. Are you having any GI issues? no ?2. Do you have a personal history of Polyps? no ?3. Do you have a family history of Colon Cancer or Polyps? no ?4. Diabetes Mellitus? no ?5. Joint replacements in the past 12 months?no ?6. Major health problems in the past 3 months?no ?7. Any artificial heart valves, MVP, or defibrillator?no ?   ?MEDICATIONS & ALLERGIES:    ?Patient reports the following regarding taking any anticoagulation/antiplatelet therapy:   ?Plavix, Coumadin, Eliquis, Xarelto, Lovenox, Pradaxa, Brilinta, or Effient? no ?Aspirin? no ? ?Patient confirms/reports the following medications:  ?Current Outpatient Medications  ?Medication Sig Dispense Refill  ? Calcium Carbonate-Vit D-Min (CALTRATE 600+D PLUS MINERALS) 600-800 MG-UNIT TABS Take 1 tablet by mouth daily at 2 PM.    ? levothyroxine (SYNTHROID) 75 MCG tablet Take 1 tablet (75 mcg total) by mouth daily before breakfast. 90 tablet 1  ? ?No current facility-administered medications for this visit.  ? ? ?Patient confirms/reports the following allergies:  ?No Known Allergies ? ?No orders of the defined types were placed in this encounter. ? ? ?AUTHORIZATION INFORMATION ?Primary Insurance: ?1D#: ?Group #: ? ?Secondary Insurance: ?1D#: ?Group #: ? ?SCHEDULE INFORMATION: ?Date: 10/07/2021 ?Time: ?Location:[facility] ? ?

## 2021-09-24 ENCOUNTER — Ambulatory Visit
Admission: RE | Admit: 2021-09-24 | Discharge: 2021-09-24 | Disposition: A | Discharge status: Home / Self Care | Payer: No Typology Code available for payment source | Source: Ambulatory Visit | Attending: Internal Medicine | Admitting: Internal Medicine

## 2021-09-24 DIAGNOSIS — Z1231 Encounter for screening mammogram for malignant neoplasm of breast: Secondary | ICD-10-CM | POA: Diagnosis present

## 2021-10-07 ENCOUNTER — Ambulatory Visit: Admit: 2021-10-07 | Payer: Medicare HMO | Admitting: Gastroenterology

## 2021-10-07 SURGERY — COLONOSCOPY WITH PROPOFOL
Anesthesia: Choice

## 2021-10-31 ENCOUNTER — Telehealth: Payer: Self-pay | Admitting: Internal Medicine

## 2021-10-31 NOTE — Telephone Encounter (Signed)
Spoke with patient  - She asked to wait until the fall to call her back to schedule  her husband is going through a lot of medical stuff right now.  ?

## 2022-01-02 ENCOUNTER — Ambulatory Visit (INDEPENDENT_AMBULATORY_CARE_PROVIDER_SITE_OTHER): Payer: No Typology Code available for payment source | Admitting: Dermatology

## 2022-01-02 DIAGNOSIS — L578 Other skin changes due to chronic exposure to nonionizing radiation: Secondary | ICD-10-CM

## 2022-01-02 DIAGNOSIS — L821 Other seborrheic keratosis: Secondary | ICD-10-CM

## 2022-01-02 DIAGNOSIS — L820 Inflamed seborrheic keratosis: Secondary | ICD-10-CM | POA: Diagnosis not present

## 2022-01-02 NOTE — Patient Instructions (Addendum)
Seborrheic Keratosis  What causes seborrheic keratoses? Seborrheic keratoses are harmless, common skin growths that first appear during adult life.  As time goes by, more growths appear.  Some people may develop a large number of them.  Seborrheic keratoses appear on both covered and uncovered body parts.  They are not caused by sunlight.  The tendency to develop seborrheic keratoses can be inherited.  They vary in color from skin-colored to gray, brown, or even black.  They can be either smooth or have a rough, warty surface.   Seborrheic keratoses are superficial and look as if they were stuck on the skin.  Under the microscope this type of keratosis looks like layers upon layers of skin.  That is why at times the top layer may seem to fall off, but the rest of the growth remains and re-grows.    Treatment Seborrheic keratoses do not need to be treated, but can easily be removed in the office.  Seborrheic keratoses often cause symptoms when they rub on clothing or jewelry.  Lesions can be in the way of shaving.  If they become inflamed, they can cause itching, soreness, or burning.  Removal of a seborrheic keratosis can be accomplished by freezing, burning, or surgery. If any spot bleeds, scabs, or grows rapidly, please return to have it checked, as these can be an indication of a skin cancer.   Due to recent changes in healthcare laws, you may see results of your pathology and/or laboratory studies on MyChart before the doctors have had a chance to review them. We understand that in some cases there may be results that are confusing or concerning to you. Please understand that not all results are received at the same time and often the doctors may need to interpret multiple results in order to provide you with the best plan of care or course of treatment. Therefore, we ask that you please give us 2 business days to thoroughly review all your results before contacting the office for clarification.  Should we see a critical lab result, you will be contacted sooner.   If You Need Anything After Your Visit  If you have any questions or concerns for your doctor, please call our main line at [phone] and press option 4 to reach your doctor's medical assistant. If no one answers, please leave a voicemail as directed and we will return your call as soon as possible. Messages left after 4 pm will be answered the following business day.   You may also send us a message via MyChart. We typically respond to MyChart messages within 1-2 business days.  For prescription refills, please ask your pharmacy to contact our office. Our fax number is [phone].  If you have an urgent issue when the clinic is closed that cannot wait until the next business day, you can page your doctor at the number below.    Please note that while we do our best to be available for urgent issues outside of office hours, we are not available 24/7.   If you have an urgent issue and are unable to reach us, you may choose to seek medical care at your doctor's office, retail clinic, urgent care center, or emergency room.  If you have a medical emergency, please immediately call 911 or go to the emergency department.  Pager Numbers  - Dr. Kowalski: [phone]  - Dr. Moye: [phone]  - Dr. Stewart: [phone]  In the event of inclement weather, please call our main line   at [phone] for an update on the status of any delays or closures.  Dermatology Medication Tips: Please keep the boxes that topical medications come in in order to help keep track of the instructions about where and how to use these. Pharmacies typically print the medication instructions only on the boxes and not directly on the medication tubes.   If your medication is too expensive, please contact our office at [phone] option 4 or send us a message through MyChart.   We are unable to tell what your co-pay for medications will be  in advance as this is different depending on your insurance coverage. However, we may be able to find a substitute medication at lower cost or fill out paperwork to get insurance to cover a needed medication.   If a prior authorization is required to get your medication covered by your insurance company, please allow us 1-2 business days to complete this process.  Drug prices often vary depending on where the prescription is filled and some pharmacies may offer cheaper prices.  The website www.goodrx.com contains coupons for medications through different pharmacies. The prices here do not account for what the cost may be with help from insurance (it may be cheaper with your insurance), but the website can give you the price if you did not use any insurance.  - You can print the associated coupon and take it with your prescription to the pharmacy.  - You may also stop by our office during regular business hours and pick up a GoodRx coupon card.  - If you need your prescription sent electronically to a different pharmacy, notify our office through [facility] MyChart or by phone at [phone] option 4.     Si Usted Necesita Algo Despus de Su Visita  Tambin puede enviarnos un mensaje a travs de MyChart. Por lo general respondemos a los mensajes de MyChart en el transcurso de 1 a 2 das hbiles.  Para renovar recetas, por favor pida a su farmacia que se ponga en contacto con nuestra oficina. Nuestro nmero de fax es el [phone].  Si tiene un asunto urgente cuando la clnica est cerrada y que no puede esperar hasta el siguiente da hbil, puede llamar/localizar a su doctor(a) al nmero que aparece a continuacin.   Por favor, tenga en cuenta que aunque hacemos todo lo posible para estar disponibles para asuntos urgentes fuera del horario de oficina, no estamos disponibles las 24 horas del da, los 7 das de la semana.   Si tiene un problema urgente y no puede comunicarse con nosotros,  puede optar por buscar atencin mdica  en el consultorio de su doctor(a), en una clnica privada, en un centro de atencin urgente o en una sala de emergencias.  Si tiene una emergencia mdica, por favor llame inmediatamente al 911 o vaya a la sala de emergencias.  Nmeros de bper  - Dr. Kowalski: [phone]  - Dra. Moye: [phone]  - Dra. Stewart: [phone]  En caso de inclemencias del tiempo, por favor llame a nuestra lnea principal al [phone] para una actualizacin sobre el estado de cualquier retraso o cierre.  Consejos para la medicacin en dermatologa: Por favor, guarde las cajas en las que vienen los medicamentos de uso tpico para ayudarle a seguir las instrucciones sobre dnde y cmo usarlos. Las farmacias generalmente imprimen las instrucciones del medicamento slo en las cajas y no directamente en los tubos del medicamento.   Si su medicamento es muy caro, por favor, pngase en contacto   con nuestra oficina llamando al [phone] y presione la opcin 4 o envenos un mensaje a travs de MyChart.   No podemos decirle cul ser su copago por los medicamentos por adelantado ya que esto es diferente dependiendo de la cobertura de su seguro. Sin embargo, es posible que podamos encontrar un medicamento sustituto a menor costo o llenar un formulario para que el seguro cubra el medicamento que se considera necesario.   Si se requiere una autorizacin previa para que su compaa de seguros cubra su medicamento, por favor permtanos de 1 a 2 das hbiles para completar este proceso.  Los precios de los medicamentos varan con frecuencia dependiendo del lugar de dnde se surte la receta y alguna farmacias pueden ofrecer precios ms baratos.  El sitio web www.goodrx.com tiene cupones para medicamentos de diferentes farmacias. Los precios aqu no tienen en cuenta lo que podra costar con la ayuda del seguro (puede ser ms barato con su seguro), pero el sitio web puede darle el  precio si no utiliz ningn seguro.  - Puede imprimir el cupn correspondiente y llevarlo con su receta a la farmacia.  - Tambin puede pasar por nuestra oficina durante el horario de atencin regular y recoger una tarjeta de cupones de GoodRx.  - Si necesita que su receta se enve electrnicamente a una farmacia diferente, informe a nuestra oficina a travs de MyChart de [facility] o por telfono llamando al [phone] y presione la opcin 4.  

## 2022-01-02 NOTE — Progress Notes (Signed)
   New Patient Visit  Subjective  Krista Burns is a 76 y.o. female who presents for the following: Irregular skin lesions (Patient is concerned and would like them checked today - L shoulder, L index finger and L hip). The patient has spots, moles and lesions to be evaluated, some may be new or changing and the patient has concerns that these could be cancer.  The following portions of the chart were reviewed this encounter and updated as appropriate:   Tobacco  Allergies  Meds  Problems  Med Hx  Surg Hx  Fam Hx     Review of Systems:  No other skin or systemic complaints except as noted in HPI or Assessment and Plan.  Objective  Well appearing patient in no apparent distress; mood and affect are within normal limits.  A focused examination was performed including the face, trunk, and extremities. Relevant physical exam findings are noted in the Assessment and Plan.  L shoulder x 1, L index finger x 1, L hip x 1 Erythematous stuck-on, waxy papule or plaque   Assessment & Plan  Inflamed seborrheic keratosis L shoulder x 1, L index finger x 1, L hip x 1  Destruction of lesion - L shoulder x 1, L index finger x 1, L hip x 1 Complexity: simple   Destruction method: cryotherapy   Informed consent: discussed and consent obtained   Timeout:  patient name, date of birth, surgical site, and procedure verified Lesion destroyed using liquid nitrogen: Yes   Region frozen until ice ball extended beyond lesion: Yes   Outcome: patient tolerated procedure well with no complications   Post-procedure details: wound care instructions given    Seborrheic Keratoses - Stuck-on, waxy, tan-brown papules and/or plaques  - Benign-appearing - Discussed benign etiology and prognosis. - Observe - Call for any changes  Actinic Damage - chronic, secondary to cumulative UV radiation exposure/sun exposure over time - diffuse scaly erythematous macules with underlying dyspigmentation - Recommend  daily broad spectrum sunscreen SPF 30+ to sun-exposed areas, reapply every 2 hours as needed.  - Recommend staying in the shade or wearing long sleeves, sun glasses (UVA+UVB protection) and wide brim hats (4-inch brim around the entire circumference of the hat). - Call for new or changing lesions.  Return if symptoms worsen or fail to improve.  Maylene Roes, CMA, am acting as scribe for Armida Sans, MD . Documentation: I have reviewed the above documentation for accuracy and completeness, and I agree with the above.  Armida Sans, MD

## 2022-01-14 ENCOUNTER — Encounter: Payer: Self-pay | Admitting: Dermatology

## 2022-04-01 ENCOUNTER — Other Ambulatory Visit: Payer: Self-pay | Admitting: Internal Medicine

## 2022-04-01 DIAGNOSIS — E039 Hypothyroidism, unspecified: Secondary | ICD-10-CM

## 2022-04-02 NOTE — Telephone Encounter (Signed)
Requested Prescriptions  Pending Prescriptions Disp Refills  . levothyroxine (SYNTHROID) 75 MCG tablet [Pharmacy Med Name: Levothyroxine Sodium 75 MCG Oral Tablet] 90 tablet 0    Sig: TAKE 1 TABLET BY MOUTH ONCE DAILY BEFORE BREAKFAST     Endocrinology:  Hypothyroid Agents Failed - 04/01/2022  4:30 PM      Failed - TSH in normal range and within 360 days    TSH  Date Value Ref Range Status  09/13/2021 0.181 (L) 0.450 - 4.500 uIU/mL Final         Passed - Valid encounter within last 12 months    Recent Outpatient Visits          6 months ago Annual physical exam   [facility] at [facility], Jesse Sans, MD   1 year ago Annual physical exam   [place] [facility] at [facility], Jesse Sans, MD   1 year ago Hypothyroidism (acquired)   [facility] at [facility], Jesse Sans, MD      Future Appointments            In 5 months [facility] Melia, Jesse Sans, MD [facility] at [facility], [facility]

## 2022-04-10 IMAGING — MG MM DIGITAL SCREENING BILAT W/ TOMO AND CAD
8 series · 9 of 24 positions shown · non-contrast
Comparison: Previous exam(s).

CLINICAL DATA: Screening.

EXAM:
DIGITAL SCREENING BILATERAL MAMMOGRAM WITH TOMOSYNTHESIS AND CAD
TECHNIQUE: Bilateral screening digital craniocaudal and mediolateral oblique
mammograms were obtained. Bilateral screening digital breast
tomosynthesis was performed. The images were evaluated with
computer-aided detection.

[R CC synth-2D]
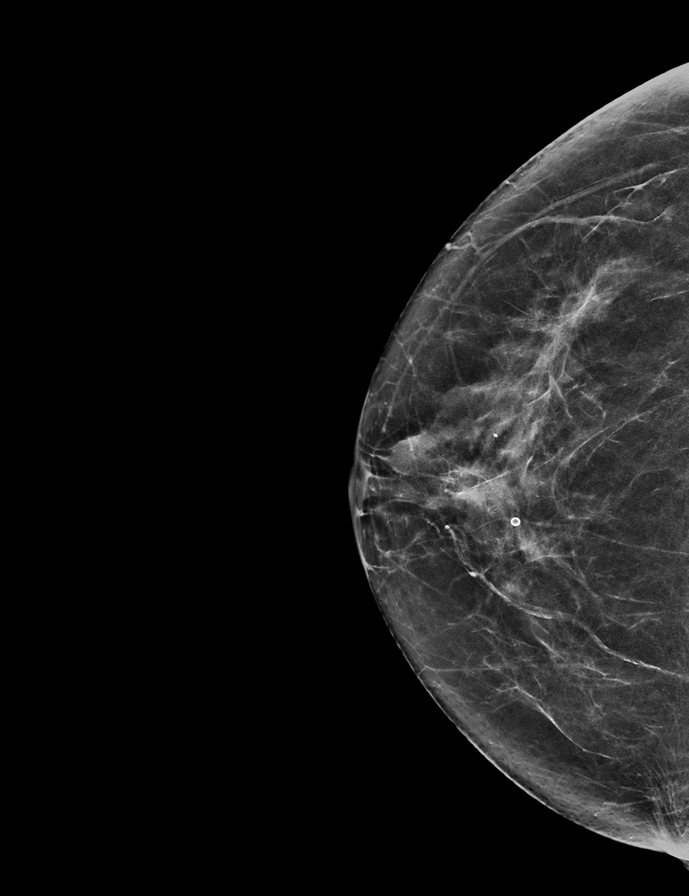

[R MLO synth-2D]
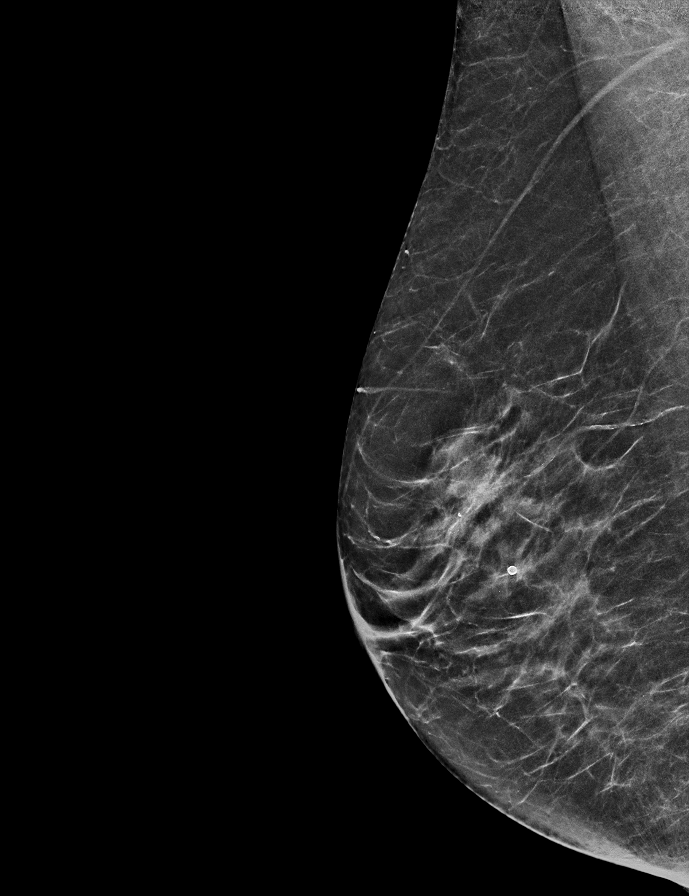

[L MLO synth-2D]
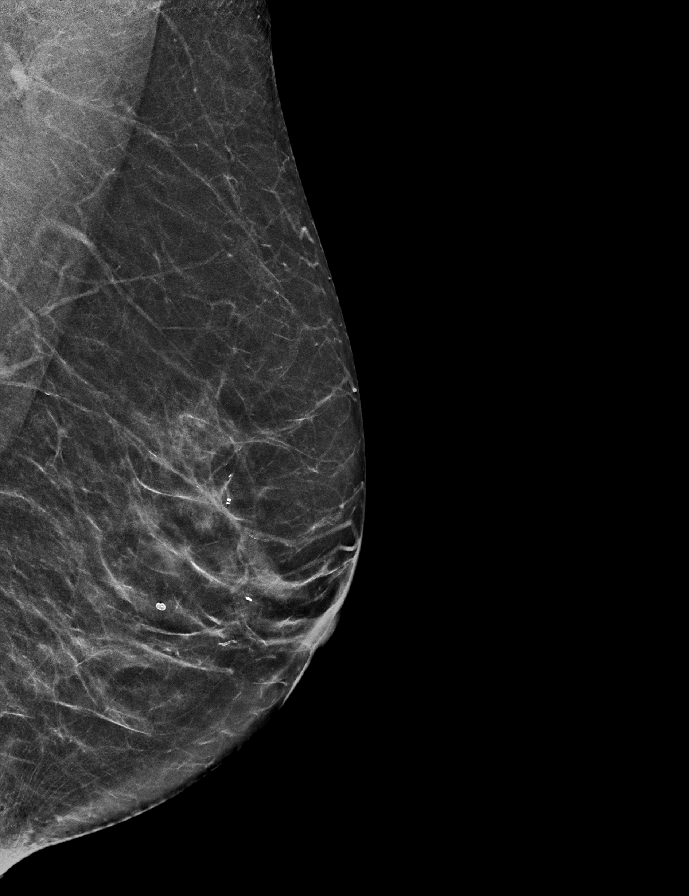

[L CC synth-2D]
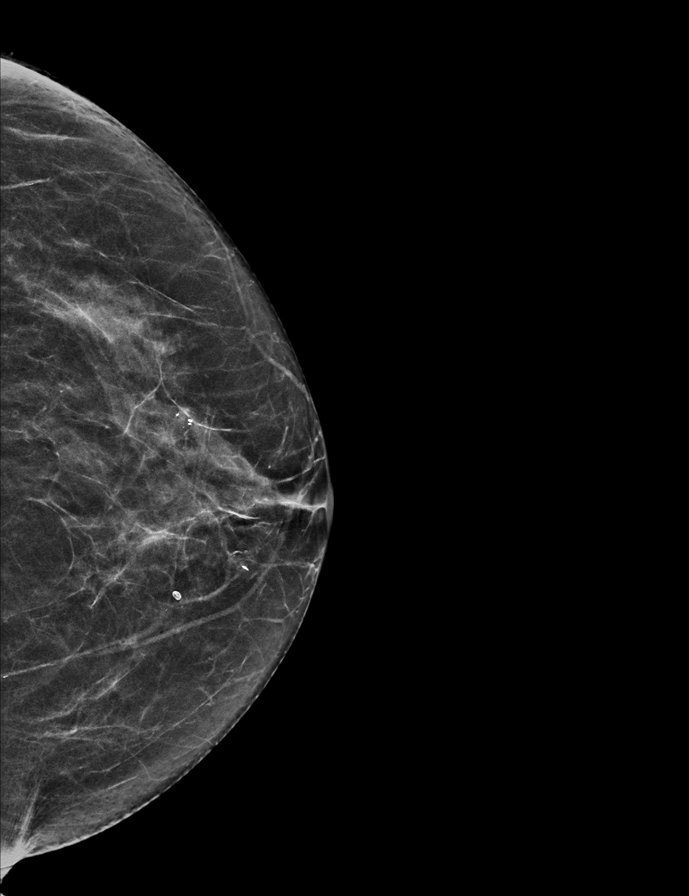

[L MLO tomo · 2 of 54 frames shown]
[frame 18/54]
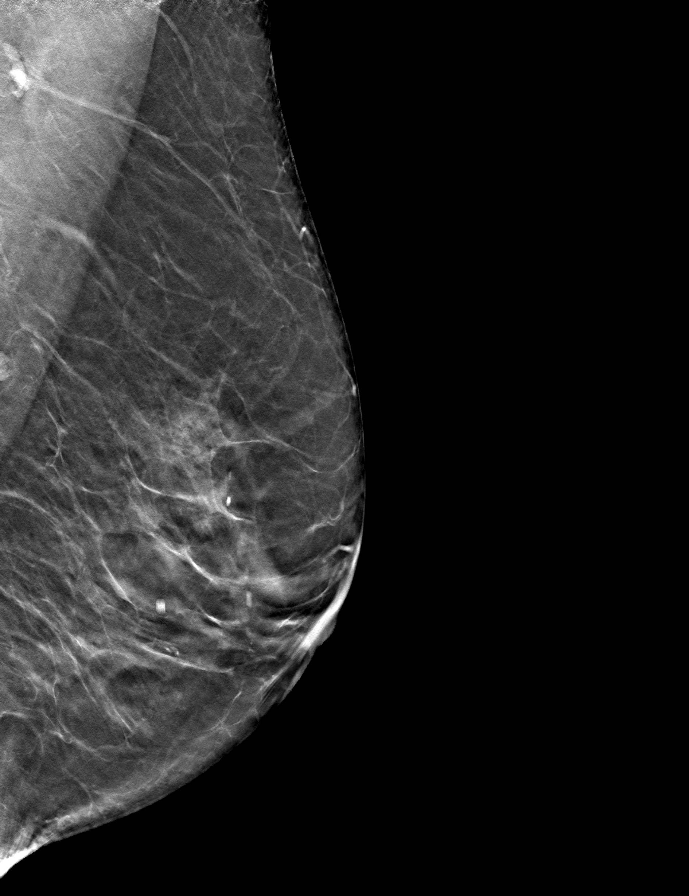
[frame 27/54]
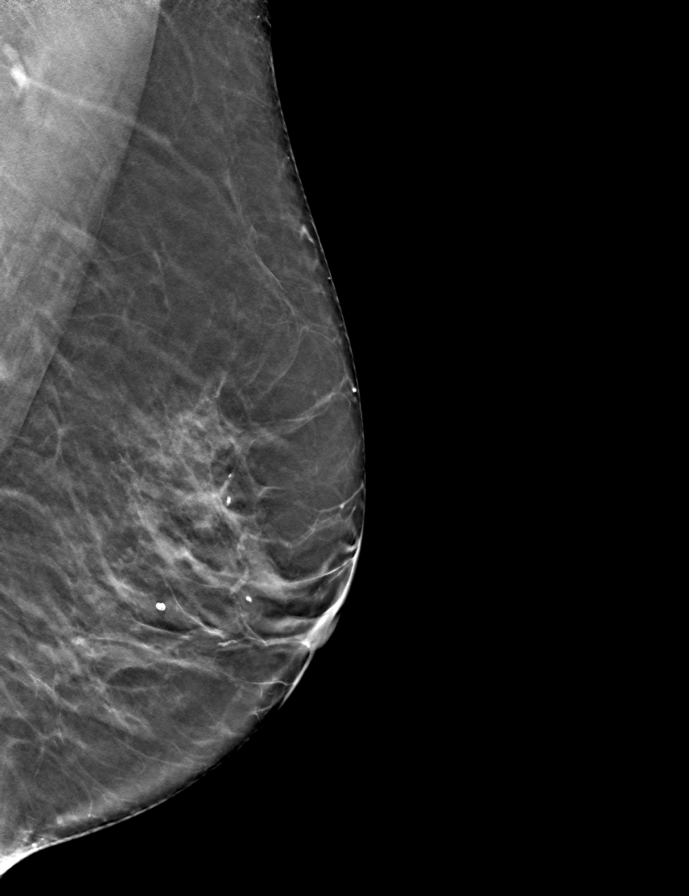

[R MLO tomo · tomo slice 28/55.0]
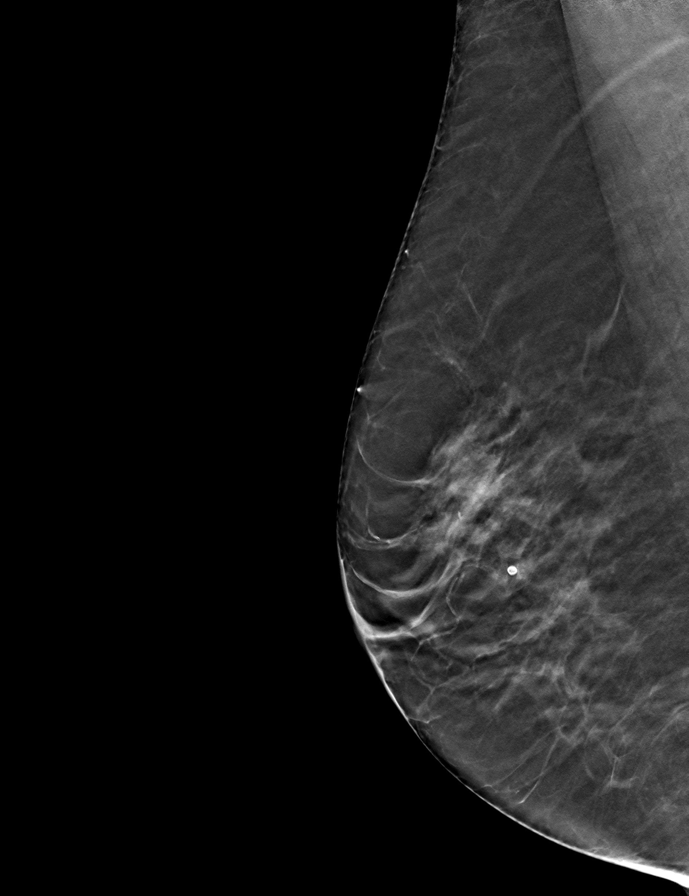

[R CC tomo · tomo slice 29/58.0]
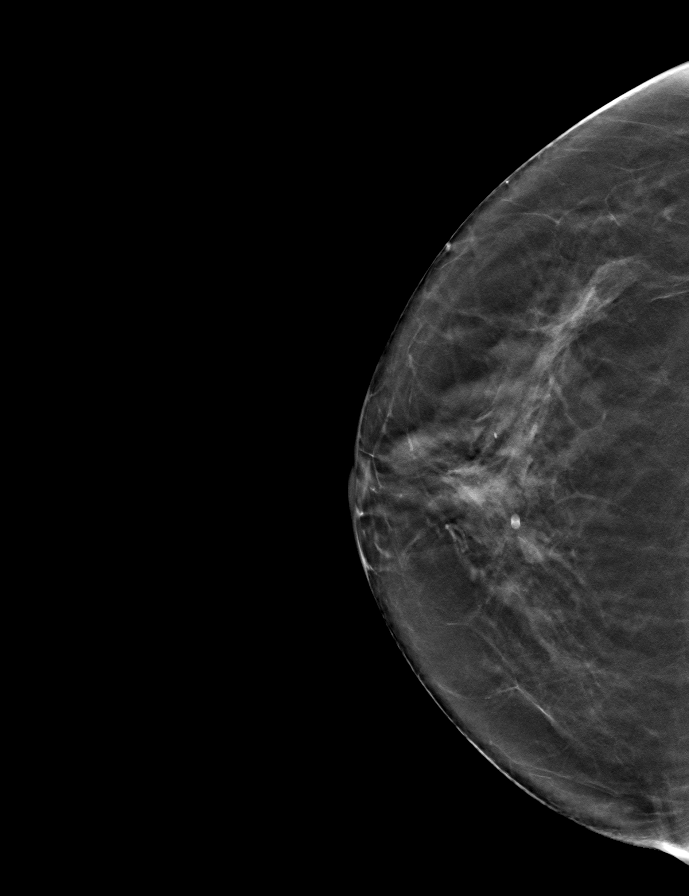

[L CC tomo · tomo slice 27/52.0]
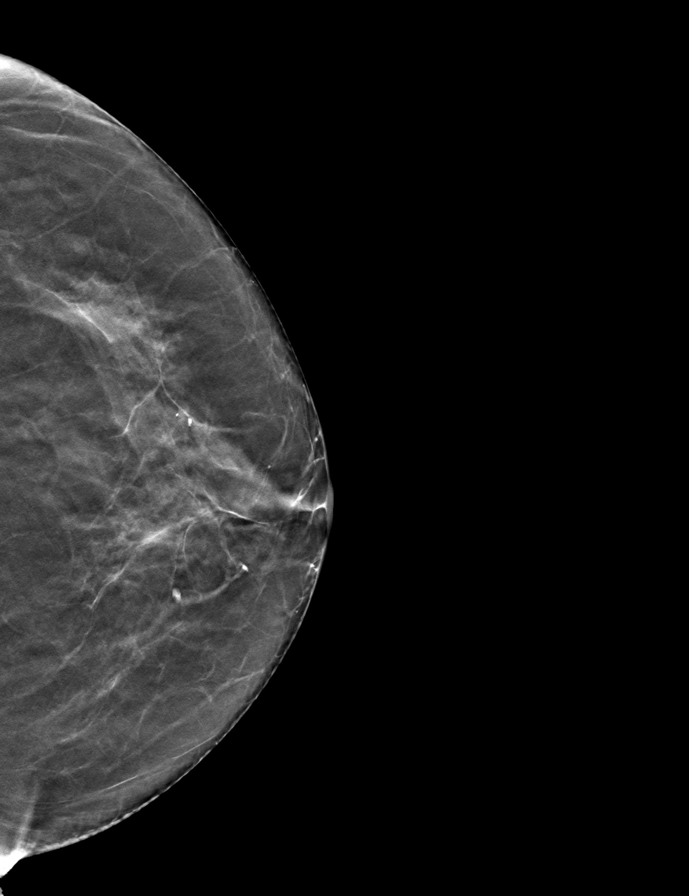

[9 of 24 positions shown; findings below may reference images not displayed]

ACR Breast Density Category b: There are scattered areas of
fibroglandular density.
FINDINGS: There are no findings suspicious for malignancy.
IMPRESSION: No mammographic evidence of malignancy. A result letter of this
screening mammogram will be mailed directly to the patient.

RECOMMENDATION:
Screening mammogram in one year. (Code:51-O-LD2)

BI-RADS CATEGORY  1: Negative.

## 2022-04-17 NOTE — Progress Notes (Signed)
I connected with  Bosie Helper on 04/18/2022 by a audio enabled telemedicine application and verified that I am speaking with the correct person using two identifiers.  Patient Location: Home  Provider Location: Office/Clinic  I discussed the limitations of evaluation and management by telemedicine. The patient expressed understanding and agreed to proceed.   Subjective:   Krista Burns is a 76 y.o. female who presents for Medicare Annual (Subsequent) preventive examination.  Review of Systems    Per HPI unless specifically indicated below.  Cardiac Risk Factors include: advanced age (>23men, >87 women);female gender, and mixed hyperlipidemia.          Objective:       04/18/2022    8:03 AM 09/13/2021    9:10 AM 09/12/2020    9:10 AM  Vitals with BMI  Height 5\' 6"  5\' 7"  5\' 7"   Weight 140 lbs 141 lbs 13 oz 172 lbs  BMI 22.61 [id number]  Systolic 123XX123 [id number] 0000000  Diastolic 74 70 74  Pulse 73 74 77    Today's Vitals   04/18/22 0803  BP: 117/74  Pulse: 73  Weight: 140 lb (63.5 kg)  Height: 5\' 6"  (1.676 m)   Body mass index is 22.6 kg/m.     04/18/2022    8:20 AM  Advanced Directives  Does Patient Have a Medical Advance Directive? No  Would patient like information on creating a medical advance directive? Yes (MAU/Ambulatory/Procedural Areas - Information given)    Current Medications (verified) Outpatient Encounter Medications as of 04/18/2022  Medication Sig   Calcium Carbonate-Vit D-Min (CALTRATE 600+D PLUS MINERALS) 600-800 MG-UNIT TABS Take 1 tablet by mouth daily at 2 PM.   levothyroxine (SYNTHROID) 75 MCG tablet TAKE 1 TABLET BY MOUTH ONCE DAILY BEFORE BREAKFAST   No facility-administered encounter medications on file as of 04/18/2022.    Allergies (verified) Patient has no known allergies.   History: Past Medical History:  Diagnosis Date   Hypothyroidism    Past Surgical History:  Procedure Laterality Date   BREAST BIOPSY Left 1972    clogged milk duct-neg   CATARACT EXTRACTION Left 2012   Family History  Problem Relation Age of Onset   Hypertension Mother    Diabetes Father    Heart disease Father    Stroke Paternal Grandmother    Diabetes Paternal Grandfather    Breast cancer Neg Hx    Social History   Socioeconomic History   Marital status: Married    Spouse name: Not on file   Number of children: 2   Years of education: Not on file   Highest education level: Not on file  Occupational History   Occupation: retired  Tobacco Use   Smoking status: Never   Smokeless tobacco: Never  Vaping Use   Vaping Use: Never used  Substance and Sexual Activity   Alcohol use: No   Drug use: Not Currently   Sexual activity: Not Currently  Other Topics Concern   Not on file  Social History Narrative   Not on file   Social Determinants of Health   Financial Resource Strain: Low Risk  (04/18/2022)   Overall Financial Resource Strain (CARDIA)    Difficulty of Paying Living Expenses: Not hard at all  Food Insecurity: No Food Insecurity (04/18/2022)   Hunger Vital Sign    Worried About Running Out of Food in the Last Year: Never true    Ran Out of Food in the Last Year: Never true  Transportation  Needs: No Transportation Needs (04/18/2022)   PRAPARE - Hydrologist (Medical): No    Lack of Transportation (Non-Medical): No  Physical Activity: Sufficiently Active (04/18/2022)   Exercise Vital Sign    Days of Exercise per Week: 7 days    Minutes of Exercise per Session: 60 min  Stress: No Stress Concern Present (04/18/2022)   [place]    Feeling of Stress : Not at all  Social Connections: Moderately Isolated (04/18/2022)   Social Connection and Isolation Panel [NHANES]    Frequency of Communication with Friends and Family: More than three times a week    Frequency of Social Gatherings with Friends and Family: More than  three times a week    Attends Religious Services: Never    Marine scientist or Organizations: No    Attends Music therapist: Never    Marital Status: Married    Tobacco Counseling Counseling given: Not Answered   Clinical Intake:  Pre-visit preparation completed: No  Pain : No/denies pain     Nutritional Status: BMI of 19-24  Normal Nutritional Risks: None Diabetes: No  How often do you need to have someone help you when you read instructions, pamphlets, or other written materials from your doctor or pharmacy?: 1 - Never  Diabetic?No  Interpreter Needed?: No  Information entered by :: Donnie Mesa, CMA   Activities of Daily Living    04/18/2022    8:08 AM 09/13/2021    9:20 AM  In your present state of health, do you have any difficulty performing the following activities:  Hearing? 0 0  Vision? 0 0  Comment Everardo Pacific, MD [facility]   Difficulty concentrating or making decisions? 0 0  Walking or climbing stairs? 0 0  Dressing or bathing? 0 0  Doing errands, shopping? 0 0    Patient Care Team: Glean Hess, MD as PCP - General (Internal Medicine) Audie Box, MD as Referring Physician (Ophthalmology) Ralene Bathe, MD (Dermatology)  Indicate any recent Medical Services you may have received from other than [facility] providers in the past year (date may be approximate). No hospitalization in the past 12 months.     Assessment:   This is a routine wellness examination for [place].  Hearing/Vision screen Denies any hearing issues. Denies any vision changes. Annual Eye Exams: Dr. Gaspar Bidding, [place], upcoming appt in Dec. 2023.   Dietary issues and exercise activities discussed: Current Exercise Habits: Structured exercise class, Type of exercise: walking, Time (Minutes): 60, Frequency (Times/Week): 6, Weekly Exercise (Minutes/Week): 360, Intensity: Moderate   Goals Addressed             This Visit's  Progress    Stay Active and Independent       Why is this important?   Regular activity or exercise is important to managing back pain.  Activity helps to keep your muscles strong.  You will sleep better and feel more relaxed.  You will have more energy and feel less stressed.  If you are not active now, start slowly. Little changes make a big difference.  Rest, but not too much.  Stay as active as you can and listen to your body's signals.           Depression Screen    04/18/2022    8:08 AM 09/13/2021    9:20 AM 09/12/2020    9:10 AM 06/05/2020  2:52 PM  PHQ 2/9 Scores  PHQ - 2 Score 0 0 0 0  PHQ- 9 Score  0 0 0    Fall Risk    04/18/2022    8:08 AM 09/13/2021    9:20 AM 09/12/2020    9:10 AM 06/05/2020    2:51 PM  Fall Risk   Falls in the past year? 0 0 0 0  Number falls in past yr: 0 0    Injury with Fall? 0 0    Risk for fall due to : No Fall Risks No Fall Risks    Follow up Falls evaluation completed Falls evaluation completed Falls evaluation completed Falls evaluation completed    [place]:  Any stairs in or around the home? No  If so, are there any without handrails? No  Home free of loose throw rugs in walkways, pet beds, electrical cords, etc? Yes  Adequate lighting in your home to reduce risk of falls? Yes   ASSISTIVE DEVICES UTILIZED TO PREVENT FALLS:  Life alert? No  Use of a cane, walker or w/c? No  Grab bars in the bathroom? Yes  Shower chair or bench in shower? Yes  Elevated toilet seat or a handicapped toilet? No   TIMED UP AND GO:  Was the test performed? No . Virtual appt  Cognitive Function:        04/18/2022    8:09 AM  6CIT Screen  What Year? 0 points  What month? 0 points  What time? 0 points  Count back from 20 0 points  Months in reverse 0 points  Repeat phrase 0 points  Total Score 0 points    Immunizations Immunization History  Administered Date(s) Administered   Influenza, High  Dose Seasonal PF 07/11/2018   PFIZER(Purple Top)SARS-COV-2 Vaccination 08/02/2019, 08/23/2019, 04/27/2020   Pneumococcal Conjugate-13 11/20/2017   Pneumococcal Polysaccharide-23 04/24/2011    TDAP status: Due, Education has been provided regarding the importance of this vaccine. Advised may receive this vaccine at local pharmacy or Health Dept. Aware to provide a copy of the vaccination record if obtained from local pharmacy or Health Dept. Verbalized acceptance and understanding.  Flu Vaccine status: Due, Education has been provided regarding the importance of this vaccine. Advised may receive this vaccine at local pharmacy or Health Dept. Aware to provide a copy of the vaccination record if obtained from local pharmacy or Health Dept. Verbalized acceptance and understanding.  Pneumococcal vaccine status: Up to date  Covid-19 vaccine status: Information provided on how to obtain vaccines.   Qualifies for Shingles Vaccine? Yes   Zostavax completed No   Shingrix Completed?: No.    Education has been provided regarding the importance of this vaccine. Patient has been advised to call insurance company to determine out of pocket expense if they have not yet received this vaccine. Advised may also receive vaccine at local pharmacy or Health Dept. Verbalized acceptance and understanding.  Screening Tests Health Maintenance  Topic Date Due   TETANUS/TDAP  Never done   Zoster Vaccines- Shingrix (1 of 2) Never done   COVID-19 Vaccine (4 - Pfizer risk series) 06/22/2020   INFLUENZA VACCINE  01/21/2022   MAMMOGRAM  09/25/2022   Pneumonia Vaccine 35+ Years old  Completed   DEXA SCAN  Completed   Hepatitis C Screening  Completed   HPV VACCINES  Aged Out   COLONOSCOPY (Pts 45-85yrs Insurance coverage will need to be confirmed)  Discontinued    Health Maintenance  Health Maintenance Due  Topic Date Due   TETANUS/TDAP  Never done   Zoster Vaccines- Shingrix (1 of 2) Never done   COVID-19  Vaccine (4 - Pfizer risk series) 06/22/2020   INFLUENZA VACCINE  01/21/2022    Colorectal cancer screening: Type of screening: Colonoscopy. Completed 04/07/2011. Repeat every 0 years  Mammogram status: Completed 09/24/2021. Repeat every year  DEXA Scan: completed 09/25/2020   Lung Cancer Screening: (Low Dose CT Chest recommended if Age 33-80 years, 30 pack-year currently smoking OR have quit w/in 15years.) does not qualify.     Additional Screening:  Hepatitis C Screening: does qualify; Completed 09/12/2020  Vision Screening: Recommended annual ophthalmology exams for early detection of glaucoma and other disorders of the eye. Is the patient up to date with their annual eye exam?  Yes  Who is the provider or what is the name of the office in which the patient attends annual eye exams? Dr. Arneta Cliche, [place] If pt is not established with a provider, would they like to be referred to a provider to establish care? No .   Dental Screening: Recommended annual dental exams for proper oral hygiene  Community Resource Referral / Chronic Care Management: CRR required this visit?  No   CCM required this visit?  No      Plan:     I have personally reviewed and noted the following in the patient's chart:   Medical and social history Use of alcohol, tobacco or illicit drugs  Current medications and supplements including opioid prescriptions. Patient is not currently taking opioid prescriptions. Functional ability and status Nutritional status Physical activity Advanced directives List of other physicians Hospitalizations, surgeries, and ER visits in previous 12 months Vitals Screenings to include cognitive, depression, and falls Referrals and appointments  In addition, I have reviewed and discussed with patient certain preventive protocols, quality metrics, and best practice recommendations. A written personalized care plan for preventive services as well as general preventive  health recommendations were provided to patient.    Ms. Sanjurjo , Thank you for taking time to come for your Medicare Wellness Visit. I appreciate your ongoing commitment to your health goals. Please review the following plan we discussed and let me know if I can assist you in the future.   These are the goals we discussed:  Goals      Stay Active and Independent     Why is this important?   Regular activity or exercise is important to managing back pain.  Activity helps to keep your muscles strong.  You will sleep better and feel more relaxed.  You will have more energy and feel less stressed.  If you are not active now, start slowly. Little changes make a big difference.  Rest, but not too much.  Stay as active as you can and listen to your body's signals.            This is a list of the screening recommended for you and due dates:  Health Maintenance  Topic Date Due   Tetanus Vaccine  Never done   Zoster (Shingles) Vaccine (1 of 2) Never done   COVID-19 Vaccine (4 - Pfizer risk series) 06/22/2020   Flu Shot  01/21/2022   Mammogram  09/25/2022   Pneumonia Vaccine  Completed   DEXA scan (bone density measurement)  Completed   Hepatitis C Screening: USPSTF Recommendation to screen - Ages 82-79 yo.  Completed   HPV Vaccine  Aged Out   Colon  Cancer Screening  Discontinued     Wilson Singer, [facility]   04/18/2022  Nurse Notes: Approximately 30 minute Non-Face-To-Face Visit

## 2022-04-17 NOTE — Patient Instructions (Signed)

## 2022-04-18 ENCOUNTER — Ambulatory Visit (INDEPENDENT_AMBULATORY_CARE_PROVIDER_SITE_OTHER): Payer: No Typology Code available for payment source

## 2022-04-18 VITALS — BP 117/74 | HR 73 | Ht 66.0 in | Wt 140.0 lb

## 2022-04-18 DIAGNOSIS — Z0000 Encounter for general adult medical examination without abnormal findings: Secondary | ICD-10-CM

## 2022-06-25 ENCOUNTER — Other Ambulatory Visit: Payer: Self-pay | Admitting: Internal Medicine

## 2022-06-25 DIAGNOSIS — E039 Hypothyroidism, unspecified: Secondary | ICD-10-CM

## 2022-07-02 DIAGNOSIS — Z961 Presence of intraocular lens: Secondary | ICD-10-CM | POA: Diagnosis not present

## 2022-08-15 ENCOUNTER — Other Ambulatory Visit: Payer: Self-pay | Admitting: Internal Medicine

## 2022-08-15 DIAGNOSIS — Z1231 Encounter for screening mammogram for malignant neoplasm of breast: Secondary | ICD-10-CM

## 2022-08-26 ENCOUNTER — Ambulatory Visit
Admission: EM | Admit: 2022-08-26 | Discharge: 2022-08-26 | Disposition: A | Discharge status: Home / Self Care | Payer: Medicare (Managed Care) | Attending: Physician Assistant | Admitting: Physician Assistant

## 2022-08-26 DIAGNOSIS — J0140 Acute pansinusitis, unspecified: Secondary | ICD-10-CM | POA: Diagnosis not present

## 2022-08-26 MED ORDER — AMOXICILLIN-POT CLAVULANATE 500-125 MG PO TABS: 1.0000 | ORAL_TABLET | Freq: Two times a day (BID) | ORAL | 0 refills | Status: DC

## 2022-08-26 MED ORDER — BENZONATATE 100 MG PO CAPS: 100.0000 mg | ORAL_CAPSULE | Freq: Three times a day (TID) | ORAL | 0 refills | Status: DC

## 2022-08-26 NOTE — ED Triage Notes (Signed)
Pt c/o sore throat,hoarseness,fever & congestion x10 days. Denies any chills or bodyaches.

## 2022-08-26 NOTE — Discharge Instructions (Addendum)
Start Augmentin twice daily for 7 days.  Take this with food.  Use over-the-counter medication including Mucinex, Tylenol, Flonase.  I also recommend nasal saline/sinus rinses and a humidifier.  Use Tessalon for cough.  If your symptoms or not improving within a few days please return for reevaluation.  If anything worsens and you have recurrent fever, chest pain, shortness of breath, weakness, nausea/vomiting interfering with oral intake, worsening cough you should be seen immediately.

## 2022-08-26 NOTE — ED Provider Notes (Signed)
[facility]    CSN: [id number] Arrival date & time: 08/26/22  1155      History   Chief Complaint Chief Complaint  Patient presents with   Sore Throat   Congestion   Fever    HPI Krista Burns is a 77 y.o. female.   Patient presents today with a 10-day history of URI symptoms.  Reports nasal congestion, cough, intermittent low-grade fever.  She denies any chest pain, shortness of breath, nausea, vomiting, diarrhea.  Reports that 5 days prior to symptom onset she was in [place] and believes that she might of been exposed during her travels.  She denies any specific known sick contacts.  She has not tried any over-the-counter medication for symptom management.  She denies history of allergies, asthma, COPD.  She does not smoke.  Denies any recent antibiotics or steroids.    Past Medical History:  Diagnosis Date   Hypothyroidism     Patient Active Problem List   Diagnosis Date Noted   Osteoporosis 09/28/2020   Mixed hyperlipidemia 09/13/2020   Tinnitus aurium, bilateral 09/12/2020   Hypothyroidism (acquired) 06/05/2020    Past Surgical History:  Procedure Laterality Date   BREAST BIOPSY Left 1972   clogged milk duct-neg   CATARACT EXTRACTION Left 2012    OB History   No obstetric history on file.      Home Medications    Prior to Admission medications   Medication Sig Start Date End Date Taking? Authorizing Provider  amoxicillin-clavulanate (AUGMENTIN) 500-125 MG tablet Take 1 tablet by mouth in the morning and at bedtime. 08/26/22  Yes Travon Crochet K, PA-C  benzonatate (TESSALON) 100 MG capsule Take 1 capsule (100 mg total) by mouth every 8 (eight) hours. 08/26/22  Yes Deloyce Walthers K, PA-C  Calcium Carbonate-Vit D-Min (CALTRATE 600+D PLUS MINERALS) 600-800 MG-UNIT TABS Take 1 tablet by mouth daily at 2 PM.   Yes [provider]  levothyroxine (SYNTHROID) 75 MCG tablet TAKE 1 TABLET BY MOUTH ONCE DAILY BEFORE BREAKFAST 06/25/22  Yes Glean Hess, MD    Family History Family History  Problem Relation Age of Onset   Hypertension Mother    Diabetes Father    Heart disease Father    Stroke Paternal Grandmother    Diabetes Paternal Grandfather    Breast cancer Neg Hx     Social History Social History   Tobacco Use   Smoking status: Never   Smokeless tobacco: Never  Vaping Use   Vaping Use: Never used  Substance Use Topics   Alcohol use: No   Drug use: Not Currently     Allergies   Patient has no known allergies.   Review of Systems Review of Systems  Constitutional:  Positive for activity change and fever. Negative for appetite change and fatigue.  HENT:  Positive for congestion, postnasal drip, sinus pressure and sore throat. Negative for sneezing.   Respiratory:  Positive for cough. Negative for shortness of breath.   Cardiovascular:  Negative for chest pain.  Gastrointestinal:  Negative for abdominal pain, diarrhea, nausea and vomiting.  Neurological:  Negative for dizziness, light-headedness and headaches.     Physical Exam Triage Vital Signs ED Triage Vitals  Enc Vitals Group     BP 08/26/22 1246 (!) 145/98     Pulse Rate 08/26/22 1246 62     Resp 08/26/22 1246 16     Temp 08/26/22 1246 98.2 F (36.8 C)     Temp Source 08/26/22 1246  Oral     SpO2 08/26/22 1246 99 %     Weight 08/26/22 1245 140 lb (63.5 kg)     Height 08/26/22 1245 '5\' 6"'$  (1.676 m)     Head Circumference --      Peak Flow --      Pain Score 08/26/22 1245 0     Pain Loc --      Pain Edu? --      Excl. in [place]? --    No data found.  Updated Vital Signs BP (!) 145/98 (BP Location: Left Arm)   Pulse 62   Temp 98.2 F (36.8 C) (Oral)   Resp 16   Ht '5\' 6"'$  (1.676 m)   Wt 140 lb (63.5 kg)   SpO2 99%   BMI 22.60 kg/m   Visual Acuity Right Eye Distance:   Left Eye Distance:   Bilateral Distance:    Right Eye Near:   Left Eye Near:    Bilateral Near:     Physical Exam Vitals reviewed.  Constitutional:       General: She is awake. She is not in acute distress.    Appearance: Normal appearance. She is well-developed. She is not ill-appearing.     Comments: Very pleasant female appears stated age in no acute distress sitting comfortably in exam room  HENT:     Head: Normocephalic and atraumatic.     Right Ear: Tympanic membrane, ear canal and external ear normal. Tympanic membrane is not erythematous or bulging.     Left Ear: Tympanic membrane, ear canal and external ear normal. Tympanic membrane is not erythematous or bulging.     Nose:     Right Sinus: Maxillary sinus tenderness present. No frontal sinus tenderness.     Left Sinus: Maxillary sinus tenderness present. No frontal sinus tenderness.     Mouth/Throat:     Pharynx: Uvula midline. Posterior oropharyngeal erythema present. No oropharyngeal exudate.     Comments: Erythema and drainage in posterior oropharynx Cardiovascular:     Rate and Rhythm: Normal rate and regular rhythm.     Heart sounds: Normal heart sounds, S1 normal and S2 normal. No murmur heard. Pulmonary:     Effort: Pulmonary effort is normal.     Breath sounds: Normal breath sounds. No wheezing, rhonchi or rales.     Comments: Clear to auscultation bilaterally Psychiatric:        Behavior: Behavior is cooperative.      UC Treatments / Results  Labs (all labs ordered are listed, but only abnormal results are displayed) Labs Reviewed - No data to display  EKG   Radiology No results found.  Procedures Procedures (including critical care time)  Medications Ordered in UC Medications - No data to display  Initial Impression / Assessment and Plan / UC Course  I have reviewed the triage vital signs and the nursing notes.  Pertinent labs & imaging results that were available during my care of the patient were reviewed by me and considered in my medical decision making (see chart for details).     Patient is well-appearing, afebrile, nontoxic, nontachycardic.   No indication for viral testing as patient has been symptomatic for over a week and this would not change management.  Given prolonged and worsening symptoms concern for secondary bacterial infections will cover for acute sinusitis.  Chest x-ray was deferred as oxygen saturation was 99% with no adventitious lung sounds on exam.  Will start Augmentin 500/125 twice daily for 7 days.  No indication for dose adjustment based on CMP from 09/13/2021 with creatinine of 0.97 and calculated creatinine clearance of 49.46 mL/min.  She was given Tessalon for cough.  Recommended over-the-counter medications including Mucinex, Flonase, Tylenol.  She is to rest and drink plenty of fluid.  Discussed that if her symptoms or not improving within a few days she should return for reevaluation.  If she has any worsening symptoms including recurrent fever, chest pain, shortness of breath, nausea/vomiting interfering with oral intake, weakness she needs to be seen immediately.  Strict return precautions given.  Final Clinical Impressions(s) / UC Diagnoses   Final diagnoses:  Acute non-recurrent pansinusitis     Discharge Instructions      Start Augmentin twice daily for 7 days.  Take this with food.  Use over-the-counter medication including Mucinex, Tylenol, Flonase.  I also recommend nasal saline/sinus rinses and a humidifier.  Use Tessalon for cough.  If your symptoms or not improving within a few days please return for reevaluation.  If anything worsens and you have recurrent fever, chest pain, shortness of breath, weakness, nausea/vomiting interfering with oral intake, worsening cough you should be seen immediately.     ED Prescriptions     Medication Sig Dispense Auth. Provider   amoxicillin-clavulanate (AUGMENTIN) 500-125 MG tablet Take 1 tablet by mouth in the morning and at bedtime. 14 tablet Kassie Keng K, PA-C   benzonatate (TESSALON) 100 MG capsule Take 1 capsule (100 mg total) by mouth every 8 (eight)  hours. 21 capsule Fionnuala Hemmerich K, PA-C      PDMP not reviewed this encounter.   Terrilee Croak, PA-C 08/26/22 1318

## 2022-09-18 ENCOUNTER — Encounter: Payer: No Typology Code available for payment source | Admitting: Internal Medicine

## 2022-09-30 ENCOUNTER — Ambulatory Visit
Admission: RE | Admit: 2022-09-30 | Discharge: 2022-09-30 | Disposition: A | Discharge status: Home / Self Care | Payer: Medicare (Managed Care) | Source: Ambulatory Visit | Attending: Internal Medicine | Admitting: Internal Medicine

## 2022-09-30 DIAGNOSIS — Z1231 Encounter for screening mammogram for malignant neoplasm of breast: Secondary | ICD-10-CM | POA: Diagnosis not present

## 2022-10-03 ENCOUNTER — Encounter: Payer: Self-pay | Admitting: Internal Medicine

## 2022-10-03 ENCOUNTER — Ambulatory Visit (INDEPENDENT_AMBULATORY_CARE_PROVIDER_SITE_OTHER): Payer: Medicare (Managed Care) | Admitting: Internal Medicine

## 2022-10-03 VITALS — BP 126/80 | HR 69 | Resp 96 | Ht 66.0 in | Wt 146.0 lb

## 2022-10-03 DIAGNOSIS — M810 Age-related osteoporosis without current pathological fracture: Secondary | ICD-10-CM | POA: Diagnosis not present

## 2022-10-03 DIAGNOSIS — Z1211 Encounter for screening for malignant neoplasm of colon: Secondary | ICD-10-CM

## 2022-10-03 DIAGNOSIS — Z0000 Encounter for general adult medical examination without abnormal findings: Secondary | ICD-10-CM | POA: Diagnosis not present

## 2022-10-03 DIAGNOSIS — E039 Hypothyroidism, unspecified: Secondary | ICD-10-CM

## 2022-10-03 DIAGNOSIS — E782 Mixed hyperlipidemia: Secondary | ICD-10-CM

## 2022-10-03 DIAGNOSIS — M79672 Pain in left foot: Secondary | ICD-10-CM

## 2022-10-03 NOTE — Assessment & Plan Note (Signed)
Continue Calcium and vitamin D Check vitamin D Repeat DEXA

## 2022-10-03 NOTE — Assessment & Plan Note (Signed)
Elevated 10 yr risk. Statin declined. Will continue low fat diet. Lab Results  Component Value Date   LDLCALC 132 (H) 09/13/2021

## 2022-10-03 NOTE — Progress Notes (Signed)
Date:  10/03/2022   Name:  Krista Burns   DOB:  03/29/1946   MRN:  [id number]   Chief Complaint: Annual Exam Krista Burns is a 77 y.o. female who presents today for her Complete Annual Exam. She feels well. She reports exercising walking, exercise bike and gym chair. She reports she is sleeping well. Breast complaints had mammogram on 09/30/2022.  Mammogram: 09/2022 DEXA: 09/2020 osteoporosis hip Pap smear: discontinued Colonoscopy: 2012 aged out  Health Maintenance Due  Topic Date Due   DTaP/Tdap/Td (1 - Tdap) Never done   COVID-19 Vaccine (4 - 2023-24 season) 02/21/2022    Immunization History  Administered Date(s) Administered   Influenza, High Dose Seasonal PF 07/11/2018   PFIZER(Purple Top)SARS-COV-2 Vaccination 08/02/2019, 08/23/2019, 04/27/2020   Pneumococcal Conjugate-13 11/20/2017   Pneumococcal Polysaccharide-23 04/24/2011    Thyroid Problem Presents for follow-up visit. Patient reports no anxiety, constipation, diarrhea, fatigue, palpitations or tremors. The symptoms have been stable.  Foot Injury  There was no injury mechanism. The pain is present in the left foot. The quality of the pain is described as burning, cramping and shooting. The pain is moderate. The pain has been Fluctuating since onset.  OP - noted on DEXA in 2022. On Calcium and vitamin D.  Should consider Fosamax, esp if repeat DEXA shows worsening disease.   Lab Results  Component Value Date   NA 140 09/13/2021   K 4.5 09/13/2021   CO2 19 (L) 09/13/2021   GLUCOSE 90 09/13/2021   BUN 17 09/13/2021   CREATININE 0.97 09/13/2021   CALCIUM 9.7 09/13/2021   EGFR 61 09/13/2021   GFRNONAA 56 07/26/2019   Lab Results  Component Value Date   CHOL 225 (H) 09/13/2021   HDL 64 09/13/2021   LDLCALC 132 (H) 09/13/2021   TRIG 164 (H) 09/13/2021   CHOLHDL 3.5 09/13/2021   Lab Results  Component Value Date   TSH 0.181 (L) 09/13/2021   Lab Results  Component Value Date   HGBA1C 5.5 07/26/2019    Lab Results  Component Value Date   WBC 6.5 09/13/2021   HGB 13.8 09/13/2021   HCT 42.0 09/13/2021   MCV 89 09/13/2021   PLT 295 09/13/2021   Lab Results  Component Value Date   ALT 11 09/13/2021   AST 16 09/13/2021   ALKPHOS 127 (H) 09/13/2021   BILITOT 0.4 09/13/2021   No results found for: "25OHVITD2", "25OHVITD3", "VD25OH"   Review of Systems  Constitutional:  Negative for chills, fatigue and fever.  HENT:  Negative for congestion, hearing loss, tinnitus, trouble swallowing and voice change.   Eyes:  Negative for visual disturbance.  Respiratory:  Negative for cough, chest tightness, shortness of breath and wheezing.   Cardiovascular:  Negative for chest pain, palpitations and leg swelling.  Gastrointestinal:  Negative for abdominal pain, constipation, diarrhea and vomiting.  Endocrine: Negative for polydipsia and polyuria.  Genitourinary:  Negative for dysuria, frequency, genital sores, vaginal bleeding and vaginal discharge.  Musculoskeletal:  Negative for arthralgias, gait problem and joint swelling.  Skin:  Negative for color change and rash.  Neurological:  Negative for dizziness, tremors, light-headedness and headaches.  Hematological:  Negative for adenopathy. Does not bruise/bleed easily.  Psychiatric/Behavioral:  Negative for dysphoric mood and sleep disturbance. The patient is not nervous/anxious.     Patient Active Problem List   Diagnosis Date Noted   Age-related osteoporosis without current pathological fracture 09/28/2020   Mixed hyperlipidemia 09/13/2020   Tinnitus aurium, bilateral 09/12/2020  Hypothyroidism (acquired) 06/05/2020    No Known Allergies  Past Surgical History:  Procedure Laterality Date   BREAST BIOPSY Left 1972   clogged milk duct-neg   CATARACT EXTRACTION Left 2012    Social History   Tobacco Use   Smoking status: Never   Smokeless tobacco: Never  Vaping Use   Vaping Use: Never used  Substance Use Topics   Alcohol  use: No   Drug use: Not Currently     Medication list has been reviewed and updated.  Current Meds  Medication Sig   Calcium Carbonate-Vit D-Min (CALTRATE 600+D PLUS MINERALS) 600-800 MG-UNIT TABS Take 1 tablet by mouth daily at 2 PM.   levothyroxine (SYNTHROID) 75 MCG tablet TAKE 1 TABLET BY MOUTH ONCE DAILY BEFORE BREAKFAST       10/03/2022    2:44 PM 09/13/2021    9:20 AM 09/12/2020    9:10 AM 06/05/2020    2:52 PM  GAD 7 : Generalized Anxiety Score  Nervous, Anxious, on Edge 0 0 0 0  Control/stop worrying 0 0 0 0  Worry too much - different things 0 0 0 0  Trouble relaxing 0 0 0 0  Restless 0 0 0 0  Easily annoyed or irritable 0 0 0 0  Afraid - awful might happen 0 0 0 0  Total GAD 7 Score 0 0 0 0  Anxiety Difficulty Not difficult at all Not difficult at all         10/03/2022    2:44 PM 04/18/2022    8:08 AM 09/13/2021    9:20 AM  Depression screen PHQ 2/9  Decreased Interest 0 0 0  Down, Depressed, Hopeless 0 0 0  PHQ - 2 Score 0 0 0  Altered sleeping 0  0  Tired, decreased energy 0  0  Change in appetite 0  0  Feeling bad or failure about yourself  0  0  Trouble concentrating 0  0  Moving slowly or fidgety/restless 0  0  Suicidal thoughts 0  0  PHQ-9 Score 0  0  Difficult doing work/chores Not difficult at all  Not difficult at all    BP Readings from Last 3 Encounters:  10/03/22 126/80  08/26/22 (!) 145/98  04/18/22 117/74    Physical Exam Vitals and nursing note reviewed.  Constitutional:      General: She is not in acute distress.    Appearance: She is well-developed.  HENT:     Head: Normocephalic and atraumatic.     Right Ear: Tympanic membrane and ear canal normal.     Left Ear: Tympanic membrane and ear canal normal.     Nose:     Right Sinus: No maxillary sinus tenderness.     Left Sinus: No maxillary sinus tenderness.  Eyes:     General: No scleral icterus.       Right eye: No discharge.        Left eye: No discharge.      Conjunctiva/sclera: Conjunctivae normal.  Neck:     Thyroid: No thyromegaly.     Vascular: No carotid bruit.  Cardiovascular:     Rate and Rhythm: Normal rate and regular rhythm.     Pulses: Normal pulses.          Posterior tibial pulses are 2+ on the right side and 2+ on the left side.     Heart sounds: Normal heart sounds.  Pulmonary:     Effort: Pulmonary effort is normal.  No respiratory distress.     Breath sounds: No wheezing.  Chest:     Comments: Breast exam deferred to recent mammogram Abdominal:     General: Bowel sounds are normal.     Palpations: Abdomen is soft.     Tenderness: There is no abdominal tenderness.  Musculoskeletal:     Cervical back: Normal range of motion. No erythema.     Right lower leg: No edema.     Left lower leg: No edema.     Right foot: Normal range of motion. No deformity.     Left foot: Normal range of motion. No deformity.  Feet:     Right foot:     Skin integrity: Skin integrity normal.     Toenail Condition: Right toenails are normal.     Left foot:     Skin integrity: Skin integrity normal.     Toenail Condition: Left toenails are normal.     Comments: Tender left forefoot on plantar aspect Lymphadenopathy:     Cervical: No cervical adenopathy.  Skin:    General: Skin is warm and dry.     Capillary Refill: Capillary refill takes less than 2 seconds.     Findings: No rash.  Neurological:     General: No focal deficit present.     Mental Status: She is alert and oriented to person, place, and time.     Cranial Nerves: No cranial nerve deficit.     Sensory: No sensory deficit.     Deep Tendon Reflexes: Reflexes are normal and symmetric.  Psychiatric:        Attention and Perception: Attention normal.        Mood and Affect: Mood normal.     Wt Readings from Last 3 Encounters:  10/03/22 146 lb (66.2 kg)  08/26/22 140 lb (63.5 kg)  04/18/22 140 lb (63.5 kg)    BP 126/80 (BP Location: Right Arm, Cuff Size: Normal)   Pulse 69    Resp (!) 96   Ht 5\' 6"  (1.676 m)   Wt 146 lb (66.2 kg)   BMI 23.57 kg/m   Assessment and Plan:  Problem List Items Addressed This Visit       Endocrine   Hypothyroidism (acquired)    Supplemented Lab Results  Component Value Date   TSH 0.181 (L) 09/13/2021        Relevant Orders   CBC with Differential/Platelet   TSH + free T4     Musculoskeletal and Integument   Age-related osteoporosis without current pathological fracture    Continue Calcium and vitamin D Check vitamin D Repeat DEXA      Relevant Orders   Comprehensive metabolic panel   VITAMIN D 25 Hydroxy (Vit-D Deficiency, Fractures)   DG Bone Density     Other   Mixed hyperlipidemia    Elevated 10 yr risk. Statin declined. Will continue low fat diet. Lab Results  Component Value Date   LDLCALC 132 (H) 09/13/2021        Relevant Orders   Lipid panel   Other Visit Diagnoses     Annual physical exam    -  Primary   Colon cancer screening       due for colonoscopy - missed last year due to husband's illness and then death   Relevant Orders   Ambulatory referral to Gastroenterology   Left foot pain       suspect Morton's neuroma   Relevant Orders   Ambulatory referral to  Podiatry       No follow-ups on file.   Partially dictated using Dragon software, any errors are not intentional.  Reubin Milan, MD [facility] Primary Care and Sports Medicine [place], [place]

## 2022-10-03 NOTE — Patient Instructions (Signed)
Call [facility] to schedule your bone density at [phone].  

## 2022-10-03 NOTE — Assessment & Plan Note (Signed)
Supplemented Lab Results  Component Value Date   TSH 0.181 (L) 09/13/2021

## 2022-10-07 DIAGNOSIS — M810 Age-related osteoporosis without current pathological fracture: Secondary | ICD-10-CM | POA: Diagnosis not present

## 2022-10-07 DIAGNOSIS — E782 Mixed hyperlipidemia: Secondary | ICD-10-CM | POA: Diagnosis not present

## 2022-10-07 DIAGNOSIS — E039 Hypothyroidism, unspecified: Secondary | ICD-10-CM | POA: Diagnosis not present

## 2022-10-08 ENCOUNTER — Encounter: Payer: Self-pay | Admitting: Internal Medicine

## 2022-10-08 DIAGNOSIS — N1831 Chronic kidney disease, stage 3a: Secondary | ICD-10-CM | POA: Insufficient documentation

## 2022-10-08 LAB — COMPREHENSIVE METABOLIC PANEL
ALT: 13 IU/L (ref 0–32)
AST: 18 IU/L (ref 0–40)
Albumin/Globulin Ratio: 1.7 (ref 1.2–2.2)
Albumin: 4.3 g/dL (ref 3.8–4.8)
Alkaline Phosphatase: 136 IU/L — ABNORMAL HIGH (ref 44–121)
BUN/Creatinine Ratio: 13 (ref 12–28)
BUN: 15 mg/dL (ref 8–27)
Bilirubin Total: 0.7 mg/dL (ref 0.0–1.2)
CO2: 19 mmol/L — ABNORMAL LOW (ref 20–29)
Calcium: 9.9 mg/dL (ref 8.7–10.3)
Chloride: 102 mmol/L (ref 96–106)
Creatinine, Ser: 1.16 mg/dL — ABNORMAL HIGH (ref 0.57–1.00)
Globulin, Total: 2.6 g/dL (ref 1.5–4.5)
Glucose: 98 mg/dL (ref 70–99)
Potassium: 4.6 mmol/L (ref 3.5–5.2)
Sodium: 138 mmol/L (ref 134–144)
Total Protein: 6.9 g/dL (ref 6.0–8.5)
eGFR: 49 mL/min/{1.73_m2} — ABNORMAL LOW (ref >59)

## 2022-10-08 LAB — LIPID PANEL
Chol/HDL Ratio: 3.4 ratio (ref 0.0–4.4)
Cholesterol, Total: 237 mg/dL — ABNORMAL HIGH (ref 100–199)
HDL: 69 mg/dL (ref >39)
LDL Chol Calc (NIH): 141 mg/dL — ABNORMAL HIGH (ref 0–99)
Triglycerides: 152 mg/dL — ABNORMAL HIGH (ref 0–149)
VLDL Cholesterol Cal: 27 mg/dL (ref 5–40)

## 2022-10-08 LAB — CBC WITH DIFFERENTIAL/PLATELET
Basophils Absolute: 0 10*3/uL (ref 0.0–0.2)
Basos: 1 %
EOS (ABSOLUTE): 0.2 10*3/uL (ref 0.0–0.4)
Eos: 2 %
Hematocrit: 42.3 % (ref 34.0–46.6)
Hemoglobin: 14 g/dL (ref 11.1–15.9)
Immature Grans (Abs): 0 10*3/uL (ref 0.0–0.1)
Immature Granulocytes: 0 %
Lymphocytes Absolute: 2.7 10*3/uL (ref 0.7–3.1)
Lymphs: 37 %
MCH: 29.5 pg (ref 26.6–33.0)
MCHC: 33.1 g/dL (ref 31.5–35.7)
MCV: 89 fL (ref 79–97)
Monocytes Absolute: 0.7 10*3/uL (ref 0.1–0.9)
Monocytes: 9 %
Neutrophils Absolute: 3.8 10*3/uL (ref 1.4–7.0)
Neutrophils: 51 %
Platelets: 301 10*3/uL (ref 150–450)
RBC: 4.74 x10E6/uL (ref 3.77–5.28)
RDW: 12.6 % (ref 11.7–15.4)
WBC: 7.4 10*3/uL (ref 3.4–10.8)

## 2022-10-08 LAB — TSH+FREE T4
Free T4: 1.51 ng/dL (ref 0.82–1.77)
TSH: 1.07 u[IU]/mL (ref 0.450–4.500)

## 2022-10-08 LAB — VITAMIN D 25 HYDROXY (VIT D DEFICIENCY, FRACTURES): Vit D, 25-Hydroxy: 27 ng/mL — ABNORMAL LOW (ref 30.0–100.0)

## 2022-10-17 ENCOUNTER — Encounter: Payer: Self-pay | Admitting: *Deleted

## 2022-10-29 ENCOUNTER — Other Ambulatory Visit: Payer: Medicare (Managed Care)

## 2022-10-30 ENCOUNTER — Ambulatory Visit: Payer: Medicare (Managed Care) | Admitting: Podiatry

## 2022-10-30 ENCOUNTER — Ambulatory Visit: Payer: Medicare (Managed Care)

## 2022-10-30 VITALS — BP 161/66 | HR 66

## 2022-10-30 DIAGNOSIS — M792 Neuralgia and neuritis, unspecified: Secondary | ICD-10-CM | POA: Diagnosis not present

## 2022-10-30 NOTE — Progress Notes (Signed)
  Subjective:  Patient ID: Krista Burns, female    DOB: [birth year],  MRN: [id number]  Chief Complaint  Patient presents with   Numbness    "I have numbness in the third toe on both feet.  On the left foot, it started out as a burning sensation.  The shooting pain of the toe started about two months ago."    77 y.o. female presents with the above complaint.  Patient presents with neuritis complaint to the third digit.  Patient states it happens sporadically but she gets shooting pain to the third this started about 3 months months ago.  She has not seen anyone else prior to seeing me denies any other acute complaints.   Review of Systems: Negative except as noted in the HPI. Denies N/V/F/Ch.  Past Medical History:  Diagnosis Date   Hypothyroidism     Current Outpatient Medications:    Calcium Carbonate-Vit D-Min (CALTRATE 600+D PLUS MINERALS) 600-800 MG-UNIT TABS, Take 1 tablet by mouth daily at 2 PM., Disp: , Rfl:    levothyroxine (SYNTHROID) 75 MCG tablet, TAKE 1 TABLET BY MOUTH ONCE DAILY BEFORE BREAKFAST, Disp: 90 tablet, Rfl: 3  Social History   Tobacco Use  Smoking Status Never  Smokeless Tobacco Never    No Known Allergies Objective:   Vitals:   10/30/22 1032  BP: (!) 161/66  Pulse: 66   There is no height or weight on file to calculate BMI. Constitutional Well developed. Well nourished.  Vascular Dorsalis pedis pulses palpable bilaterally. Posterior tibial pulses palpable bilaterally. Capillary refill normal to all digits.  No cyanosis or clubbing noted. Pedal hair growth normal.  Neurologic Normal speech. Oriented to person, place, and time. Epicritic sensation to light touch grossly present bilaterally.  Dermatologic Nails well groomed and normal in appearance. No open wounds. No skin lesions.  Orthopedic: No pain on palpation to the third digit.  Mild hammertoe contracture noted.  Positive Tinel's sign to the superficial peroneal nerve going into the  third digit at the midfoot   Radiographs: None Assessment:   1. Neuritis    Plan:  Patient was evaluated and treated and all questions answered.  Left neuritis with compression of the nerve at the midfoot -All questions and concerns were discussed with the patient in extensive detail.  I discussed with her in extensive detail about shoe gear modification and padding and protecting the arthritis on the dorsal midfoot.  She states understanding.  If any foot and ankle issues on future she will come back and see me.

## 2022-11-06 ENCOUNTER — Ambulatory Visit
Admission: RE | Admit: 2022-11-06 | Discharge: 2022-11-06 | Disposition: A | Discharge status: Home / Self Care | Payer: Medicare (Managed Care) | Source: Ambulatory Visit | Attending: Internal Medicine | Admitting: Internal Medicine

## 2022-11-06 DIAGNOSIS — M810 Age-related osteoporosis without current pathological fracture: Secondary | ICD-10-CM | POA: Diagnosis not present

## 2023-04-16 ENCOUNTER — Ambulatory Visit: Payer: Medicare (Managed Care) | Admitting: Internal Medicine

## 2023-06-10 ENCOUNTER — Telehealth: Payer: Self-pay | Admitting: Internal Medicine

## 2023-06-10 ENCOUNTER — Other Ambulatory Visit: Payer: Self-pay | Admitting: Internal Medicine

## 2023-06-10 DIAGNOSIS — E039 Hypothyroidism, unspecified: Secondary | ICD-10-CM

## 2023-06-10 NOTE — Telephone Encounter (Signed)
Copied from CRM [id number]. Topic: Medicare AWV >> Jun 10, 2023  1:21 PM Payton Doughty wrote: Reason for CRM: Called LVM 06/10/2023 to schedule Annual Wellness Visit. Please schedule office or virtual visits.  Krista Burns; Care Guide Ambulatory Clinical Support [place] [facility] Direct Dial: [phone]

## 2023-06-10 NOTE — Telephone Encounter (Signed)
Requested a little early- but with holidays-will allow- last lab TSH 10/07/22 Requested Prescriptions  Pending Prescriptions Disp Refills   levothyroxine (SYNTHROID) 75 MCG tablet [Pharmacy Med Name: Levothyroxine Sodium 75 MCG Oral Tablet] 90 tablet 0    Sig: TAKE 1 TABLET BY MOUTH ONCE DAILY BEFORE BREAKFAST     Endocrinology:  Hypothyroid Agents Passed - 06/10/2023 12:05 PM      Passed - TSH in normal range and within 360 days    TSH  Date Value Ref Range Status  10/07/2022 1.070 0.450 - 4.500 uIU/mL Final         Passed - Valid encounter within last 12 months    Recent Outpatient Visits           8 months ago Annual physical exam   [place] [facility] at [facility], Nyoka Cowden, MD   1 year ago Annual physical exam   [facility] Primary Care & Sports Medicine at [facility], Nyoka Cowden, MD   2 years ago Annual physical exam   [facility] Medicine at [facility], Nyoka Cowden, MD   3 years ago Hypothyroidism (acquired)   [facility] Health Primary Care & Sports Medicine at [facility], Nyoka Cowden, MD       Future Appointments             Tomorrow Judithann Graves Nyoka Cowden, MD [facility] Health Primary Care & Sports Medicine at [facility], [facility]   In 3 months Judithann Graves, Nyoka Cowden, MD [facility] Health Primary Care & Sports Medicine at [facility], [facility]

## 2023-06-11 ENCOUNTER — Encounter: Payer: Self-pay | Admitting: Internal Medicine

## 2023-06-11 ENCOUNTER — Ambulatory Visit (INDEPENDENT_AMBULATORY_CARE_PROVIDER_SITE_OTHER): Payer: Medicare (Managed Care) | Admitting: Internal Medicine

## 2023-06-11 VITALS — BP 134/78 | HR 76 | Ht 66.0 in | Wt 163.6 lb

## 2023-06-11 DIAGNOSIS — E782 Mixed hyperlipidemia: Secondary | ICD-10-CM | POA: Diagnosis not present

## 2023-06-11 DIAGNOSIS — E559 Vitamin D deficiency, unspecified: Secondary | ICD-10-CM | POA: Diagnosis not present

## 2023-06-11 DIAGNOSIS — E039 Hypothyroidism, unspecified: Secondary | ICD-10-CM

## 2023-06-11 DIAGNOSIS — N1831 Chronic kidney disease, stage 3a: Secondary | ICD-10-CM

## 2023-06-11 DIAGNOSIS — M720 Palmar fascial fibromatosis [Dupuytren]: Secondary | ICD-10-CM | POA: Diagnosis not present

## 2023-06-11 DIAGNOSIS — Z1231 Encounter for screening mammogram for malignant neoplasm of breast: Secondary | ICD-10-CM | POA: Diagnosis not present

## 2023-06-11 NOTE — Progress Notes (Signed)
Date:  06/11/2023   Name:  Krista Burns   DOB:  12-28-1945   MRN:  [id number]   Chief Complaint: Hypothyroidism, Hyperlipidemia, and Chronic Kidney Disease  Thyroid Problem Presents for follow-up visit. Patient reports no anxiety, constipation, diarrhea, fatigue or palpitations. The symptoms have been stable. Her past medical history is significant for hyperlipidemia.  Hyperlipidemia This is a chronic problem. Recent lipid tests were reviewed and are high. Pertinent negatives include no chest pain or shortness of breath. Current antihyperlipidemic treatment includes diet change.  CKD - borderline low GFR since 2022 with normal of 61 in 2023.  Not any nephrotoxic drugs, maintaining hydration.   Hand issue - has some thickening on her right palm.  She does a lot of crafting daily but has no pain or triggering.  Review of Systems  Constitutional:  Negative for fatigue and unexpected weight change.  HENT:  Negative for nosebleeds.   Eyes:  Negative for visual disturbance.  Respiratory:  Negative for cough, chest tightness, shortness of breath and wheezing.   Cardiovascular:  Negative for chest pain, palpitations and leg swelling.  Gastrointestinal:  Negative for abdominal pain, constipation and diarrhea.  Neurological:  Negative for dizziness, weakness, light-headedness and headaches.  Psychiatric/Behavioral:  Negative for dysphoric mood and sleep disturbance. The patient is not nervous/anxious.      Lab Results  Component Value Date   NA 138 10/07/2022   K 4.6 10/07/2022   CO2 19 (L) 10/07/2022   GLUCOSE 98 10/07/2022   BUN 15 10/07/2022   CREATININE 1.16 (H) 10/07/2022   CALCIUM 9.9 10/07/2022   EGFR 49 (L) 10/07/2022   GFRNONAA 56 07/26/2019   Lab Results  Component Value Date   CHOL 237 (H) 10/07/2022   HDL 69 10/07/2022   LDLCALC 141 (H) 10/07/2022   TRIG 152 (H) 10/07/2022   CHOLHDL 3.4 10/07/2022   Lab Results  Component Value Date   TSH 1.070 10/07/2022    Lab Results  Component Value Date   HGBA1C 5.5 07/26/2019   Lab Results  Component Value Date   WBC 7.4 10/07/2022   HGB 14.0 10/07/2022   HCT 42.3 10/07/2022   MCV 89 10/07/2022   PLT 301 10/07/2022   Lab Results  Component Value Date   ALT 13 10/07/2022   AST 18 10/07/2022   ALKPHOS 136 (H) 10/07/2022   BILITOT 0.7 10/07/2022   Lab Results  Component Value Date   VD25OH 27.0 (L) 10/07/2022     Patient Active Problem List   Diagnosis Date Noted   Vitamin D deficiency 06/11/2023   Palmar fascial fibromatosis 06/11/2023   Stage 3a chronic kidney disease (CKD) (HCC) 10/08/2022   Age-related osteoporosis without current pathological fracture 09/28/2020   Mixed hyperlipidemia 09/13/2020   Tinnitus aurium, bilateral 09/12/2020   Hypothyroidism (acquired) 06/05/2020    No Known Allergies  Past Surgical History:  Procedure Laterality Date   BREAST BIOPSY Left 1972   clogged milk duct-neg   CATARACT EXTRACTION Left 2012    Social History   Tobacco Use   Smoking status: Never   Smokeless tobacco: Never  Vaping Use   Vaping status: Never Used  Substance Use Topics   Alcohol use: No   Drug use: Not Currently     Medication list has been reviewed and updated.  Current Meds  Medication Sig   Calcium Carbonate-Vit D-Min (CALTRATE 600+D PLUS MINERALS) 600-800 MG-UNIT TABS Take 1 tablet by mouth daily at 2 PM.   cholecalciferol (  VITAMIN D3) 25 MCG (1000 UNIT) tablet Take 1,000 Units by mouth daily.   levothyroxine (SYNTHROID) 75 MCG tablet TAKE 1 TABLET BY MOUTH ONCE DAILY BEFORE BREAKFAST       06/11/2023    8:25 AM 10/03/2022    2:44 PM 09/13/2021    9:20 AM 09/12/2020    9:10 AM  GAD 7 : Generalized Anxiety Score  Nervous, Anxious, on Edge 0 0 0 0  Control/stop worrying 0 0 0 0  Worry too much - different things 0 0 0 0  Trouble relaxing 0 0 0 0  Restless 0 0 0 0  Easily annoyed or irritable 0 0 0 0  Afraid - awful might happen 0 0 0 0  Total GAD 7  Score 0 0 0 0  Anxiety Difficulty Not difficult at all Not difficult at all Not difficult at all        06/11/2023    8:25 AM 10/03/2022    2:44 PM 04/18/2022    8:08 AM  Depression screen PHQ 2/9  Decreased Interest 0 0 0  Down, Depressed, Hopeless 0 0 0  PHQ - 2 Score 0 0 0  Altered sleeping 0 0   Tired, decreased energy 0 0   Change in appetite 0 0   Feeling bad or failure about yourself  0 0   Trouble concentrating 0 0   Moving slowly or fidgety/restless 0 0   Suicidal thoughts 0 0   PHQ-9 Score 0 0   Difficult doing work/chores Not difficult at all Not difficult at all     BP Readings from Last 3 Encounters:  06/11/23 134/78  10/30/22 (!) 161/66  10/03/22 126/80    Physical Exam Vitals and nursing note reviewed.  Constitutional:      General: She is not in acute distress.    Appearance: She is well-developed.  HENT:     Head: Normocephalic and atraumatic.  Neck:     Vascular: No carotid bruit.  Cardiovascular:     Rate and Rhythm: Normal rate and regular rhythm.     Pulses: Normal pulses.  Pulmonary:     Effort: Pulmonary effort is normal. No respiratory distress.  Musculoskeletal:     Cervical back: Normal range of motion.     Right lower leg: No edema.     Left lower leg: No edema.     Comments: Thickening of the right palm at the base of fourth finger  Lymphadenopathy:     Cervical: No cervical adenopathy.  Skin:    General: Skin is warm and dry.     Findings: No rash.  Neurological:     Mental Status: She is alert and oriented to person, place, and time.  Psychiatric:        Mood and Affect: Mood normal.        Behavior: Behavior normal.     Wt Readings from Last 3 Encounters:  06/11/23 163 lb 9.6 oz (74.2 kg)  10/03/22 146 lb (66.2 kg)  08/26/22 140 lb (63.5 kg)    BP 134/78 (BP Location: Left Arm, Cuff Size: Normal)   Pulse 76   Ht 5\' 6"  (1.676 m)   Wt 163 lb 9.6 oz (74.2 kg)   SpO2 100%   BMI 26.41 kg/m   Assessment and  Plan:  Problem List Items Addressed This Visit       Unprioritized   Hypothyroidism (acquired) - Primary   Supplemented with normal TFTs. Continue same dose. Last thyroid  functions Lab Results  Component Value Date   TSH 1.070 10/07/2022         Relevant Orders   TSH + free T4   Mixed hyperlipidemia   Elevated 10 yr risk - should consider statin therapy. Lab Results  Component Value Date   LDLCALC 141 (H) 10/07/2022         Stage 3a chronic kidney disease (CKD) (HCC)   Will recheck today;  advise avoiding all NSAIDS and maintaining hydration      Relevant Orders   Basic metabolic panel   Phosphorus   VITAMIN D 25 Hydroxy (Vit-D Deficiency, Fractures)   Vitamin D deficiency   On calcium and vitamin D supplement daily.      Relevant Orders   VITAMIN D 25 Hydroxy (Vit-D Deficiency, Fractures)   Palmar fascial fibromatosis   Can refer to Ortho or SM if symptoms become bothersome      Other Visit Diagnoses       Encounter for screening mammogram for breast cancer       Relevant Orders   MM 3D SCREENING MAMMOGRAM BILATERAL BREAST       No follow-ups on file.    Reubin Milan, MD [facility] Health Primary Care and Sports Medicine [place]

## 2023-06-11 NOTE — Patient Instructions (Signed)
Call [facility] to schedule your mammogram at [phone].  

## 2023-06-11 NOTE — Assessment & Plan Note (Signed)
Will recheck today;  advise avoiding all NSAIDS and maintaining hydration

## 2023-06-11 NOTE — Assessment & Plan Note (Signed)
Can refer to Ortho or SM if symptoms become bothersome

## 2023-06-11 NOTE — Assessment & Plan Note (Signed)
Supplemented with normal TFTs. Continue same dose. Last thyroid functions Lab Results  Component Value Date   TSH 1.070 10/07/2022

## 2023-06-11 NOTE — Assessment & Plan Note (Signed)
Elevated 10 yr risk - should consider statin therapy. Lab Results  Component Value Date   LDLCALC 141 (H) 10/07/2022

## 2023-06-11 NOTE — Assessment & Plan Note (Signed)
On calcium and vitamin D supplement daily.

## 2023-06-12 LAB — TSH+FREE T4
Free T4: 1.42 ng/dL (ref 0.82–1.77)
TSH: 3.32 u[IU]/mL (ref 0.450–4.500)

## 2023-06-12 LAB — BASIC METABOLIC PANEL
BUN/Creatinine Ratio: 15 (ref 12–28)
BUN: 15 mg/dL (ref 8–27)
CO2: 16 mmol/L — ABNORMAL LOW (ref 20–29)
Calcium: 9.6 mg/dL (ref 8.7–10.3)
Chloride: 106 mmol/L (ref 96–106)
Creatinine, Ser: 1.03 mg/dL — ABNORMAL HIGH (ref 0.57–1.00)
Glucose: 91 mg/dL (ref 70–99)
Potassium: 4.5 mmol/L (ref 3.5–5.2)
Sodium: 137 mmol/L (ref 134–144)
eGFR: 56 mL/min/{1.73_m2} — ABNORMAL LOW (ref >59)

## 2023-06-12 LAB — PHOSPHORUS: Phosphorus: 2.6 mg/dL — ABNORMAL LOW (ref 3.0–4.3)

## 2023-06-12 LAB — VITAMIN D 25 HYDROXY (VIT D DEFICIENCY, FRACTURES): Vit D, 25-Hydroxy: 33.2 ng/mL (ref 30.0–100.0)

## 2023-09-08 ENCOUNTER — Other Ambulatory Visit: Payer: Self-pay | Admitting: Internal Medicine

## 2023-09-08 DIAGNOSIS — E039 Hypothyroidism, unspecified: Secondary | ICD-10-CM

## 2023-09-09 NOTE — Telephone Encounter (Signed)
 Requested Prescriptions  Pending Prescriptions Disp Refills   levothyroxine (SYNTHROID) 75 MCG tablet [Pharmacy Med Name: Levothyroxine Sodium 75 MCG Oral Tablet] 90 tablet 0    Sig: TAKE 1 TABLET BY MOUTH ONCE DAILY BEFORE BREAKFAST     Endocrinology:  Hypothyroid Agents Passed - 09/09/2023 11:47 AM      Passed - TSH in normal range and within 360 days    TSH  Date Value Ref Range Status  06/11/2023 3.320 0.450 - 4.500 uIU/mL Final         Passed - Valid encounter within last 12 months    Recent Outpatient Visits           3 months ago Hypothyroidism (acquired)   [place] [facility] at [facility], Nyoka Cowden, MD   11 months ago Annual physical exam   [facility] Primary Care & Sports Medicine at [facility], Nyoka Cowden, MD   1 year ago Annual physical exam   [facility] Health Primary Care & Sports Medicine at [facility], Nyoka Cowden, MD   2 years ago Annual physical exam   [facility] Primary Care & Sports Medicine at [facility], Nyoka Cowden, MD   3 years ago Hypothyroidism (acquired)   [facility] Health Primary Care & Sports Medicine at [facility], Nyoka Cowden, MD       Future Appointments             In 3 weeks Judithann Graves Nyoka Cowden, MD [facility] Health Primary Care & Sports Medicine at [facility], [facility]

## 2023-10-01 ENCOUNTER — Ambulatory Visit
Admission: RE | Admit: 2023-10-01 | Discharge: 2023-10-01 | Disposition: A | Discharge status: Home / Self Care | Payer: Medicare (Managed Care) | Source: Ambulatory Visit | Attending: Internal Medicine | Admitting: Internal Medicine

## 2023-10-01 DIAGNOSIS — Z1231 Encounter for screening mammogram for malignant neoplasm of breast: Secondary | ICD-10-CM | POA: Diagnosis not present

## 2023-10-06 ENCOUNTER — Encounter: Payer: Self-pay | Admitting: Internal Medicine

## 2023-10-06 ENCOUNTER — Ambulatory Visit (INDEPENDENT_AMBULATORY_CARE_PROVIDER_SITE_OTHER): Payer: Medicare (Managed Care) | Admitting: Internal Medicine

## 2023-10-06 VITALS — BP 138/70 | HR 70 | Ht 66.0 in | Wt 167.5 lb

## 2023-10-06 DIAGNOSIS — N1831 Chronic kidney disease, stage 3a: Secondary | ICD-10-CM | POA: Diagnosis not present

## 2023-10-06 DIAGNOSIS — E039 Hypothyroidism, unspecified: Secondary | ICD-10-CM

## 2023-10-06 DIAGNOSIS — Z0000 Encounter for general adult medical examination without abnormal findings: Secondary | ICD-10-CM | POA: Diagnosis not present

## 2023-10-06 DIAGNOSIS — E559 Vitamin D deficiency, unspecified: Secondary | ICD-10-CM

## 2023-10-06 DIAGNOSIS — M810 Age-related osteoporosis without current pathological fracture: Secondary | ICD-10-CM

## 2023-10-06 DIAGNOSIS — E782 Mixed hyperlipidemia: Secondary | ICD-10-CM | POA: Diagnosis not present

## 2023-10-06 NOTE — Progress Notes (Signed)
 Date:  10/06/2023   Name:  Krista Burns   DOB:  1946/02/12   MRN:  [id number]   Chief Complaint: Annual Exam Krista Burns is a 78 y.o. female who presents today for her Complete Annual Exam. She feels well. She reports exercising walks,30 - 45 minutes a day . She reports she is sleeping well. Breast complaints none.  Health Maintenance  Topic Date Due   DTaP/Tdap/Td vaccine (1 - Tdap) Never done   Medicare Annual Wellness Visit  04/19/2023   COVID-19 Vaccine (4 - 2024-25 season) 10/22/2023*   Flu Shot  01/22/2024   Mammogram  09/30/2024   Pneumonia Vaccine  Completed   DEXA scan (bone density measurement)  Completed   Hepatitis C Screening  Completed   HPV Vaccine  Aged Out   Meningitis B Vaccine  Aged Out   Colon Cancer Screening  Discontinued   Zoster (Shingles) Vaccine  Discontinued  *Topic was postponed. The date shown is not the original due date.    Thyroid Problem Presents for follow-up visit. Patient reports no anxiety, constipation, diarrhea, fatigue or palpitations. The symptoms have been stable.  Osteopenia/osteoporosis: on Ca+ and D.  Slight worsening of values last year.  AP Spine L1-L3 11/06/2022 76.6 Osteopenia -1.8 0.958 g/cm2 AP Spine L1-L3 09/25/2020 74.5 Osteopenia -1.3 1.020 g/cm2   DualFemur Total Right 11/06/2022 76.6 Osteoporosis -2.8 0.656 g/cm2 DualFemur Total Right 09/25/2020 74.5 Osteopenia -2.4 0.704 g/cm2   DualFemur Total Mean 11/06/2022 76.6 Osteoporosis -2.7 0.666 g/cm2 DualFemur Total Mean 09/25/2020 74.5 Osteopenia -2.3 0.714 g/cm2  Review of Systems  Constitutional:  Negative for fatigue and unexpected weight change.  Eyes:  Negative for visual disturbance.  Respiratory:  Negative for cough, chest tightness, shortness of breath and wheezing.   Cardiovascular:  Negative for chest pain, palpitations and leg swelling.  Gastrointestinal:  Negative for abdominal pain, constipation and diarrhea.  Genitourinary:  Negative for dysuria  and hematuria.  Musculoskeletal:  Negative for arthralgias and myalgias.  Neurological:  Negative for dizziness, weakness, light-headedness and headaches.  Psychiatric/Behavioral:  Negative for dysphoric mood and sleep disturbance. The patient is not nervous/anxious.      Lab Results  Component Value Date   NA 137 06/11/2023   K 4.5 06/11/2023   CO2 16 (L) 06/11/2023   GLUCOSE 91 06/11/2023   BUN 15 06/11/2023   CREATININE 1.03 (H) 06/11/2023   CALCIUM 9.6 06/11/2023   EGFR 56 (L) 06/11/2023   GFRNONAA 56 07/26/2019   Lab Results  Component Value Date   CHOL 237 (H) 10/07/2022   HDL 69 10/07/2022   LDLCALC 141 (H) 10/07/2022   TRIG 152 (H) 10/07/2022   CHOLHDL 3.4 10/07/2022   Lab Results  Component Value Date   TSH 3.320 06/11/2023   Lab Results  Component Value Date   HGBA1C 5.5 07/26/2019   Lab Results  Component Value Date   WBC 7.4 10/07/2022   HGB 14.0 10/07/2022   HCT 42.3 10/07/2022   MCV 89 10/07/2022   PLT 301 10/07/2022   Lab Results  Component Value Date   ALT 13 10/07/2022   AST 18 10/07/2022   ALKPHOS 136 (H) 10/07/2022   BILITOT 0.7 10/07/2022   Lab Results  Component Value Date   VD25OH 33.2 06/11/2023     Patient Active Problem List   Diagnosis Date Noted   Vitamin D deficiency 06/11/2023   Palmar fascial fibromatosis 06/11/2023   Stage 3a chronic kidney disease (CKD) (HCC) 10/08/2022  Age-related osteoporosis without current pathological fracture 09/28/2020   Mixed hyperlipidemia 09/13/2020   Tinnitus aurium, bilateral 09/12/2020   Hypothyroidism (acquired) 06/05/2020    No Known Allergies  Past Surgical History:  Procedure Laterality Date   BREAST BIOPSY Left 1972   clogged milk duct-neg   CATARACT EXTRACTION Left 2012    Social History   Tobacco Use   Smoking status: Never   Smokeless tobacco: Never  Vaping Use   Vaping status: Never Used  Substance Use Topics   Alcohol use: No   Drug use: Not Currently      Medication list has been reviewed and updated.  Current Meds  Medication Sig   Calcium Carbonate-Vit D-Min (CALTRATE 600+D PLUS MINERALS) 600-800 MG-UNIT TABS Take 1 tablet by mouth daily at 2 PM.   cholecalciferol (VITAMIN D3) 25 MCG (1000 UNIT) tablet Take 1,000 Units by mouth daily.   levothyroxine (SYNTHROID) 75 MCG tablet TAKE 1 TABLET BY MOUTH ONCE DAILY BEFORE BREAKFAST       10/06/2023    8:06 AM 06/11/2023    8:25 AM 10/03/2022    2:44 PM 09/13/2021    9:20 AM  GAD 7 : Generalized Anxiety Score  Nervous, Anxious, on Edge 0 0 0 0  Control/stop worrying 0 0 0 0  Worry too much - different things 0 0 0 0  Trouble relaxing 0 0 0 0  Restless 0 0 0 0  Easily annoyed or irritable 0 0 0 0  Afraid - awful might happen 0 0 0 0  Total GAD 7 Score 0 0 0 0  Anxiety Difficulty Not difficult at all Not difficult at all Not difficult at all Not difficult at all       10/06/2023    8:06 AM 06/11/2023    8:25 AM 10/03/2022    2:44 PM  Depression screen PHQ 2/9  Decreased Interest 0 0 0  Down, Depressed, Hopeless 0 0 0  PHQ - 2 Score 0 0 0  Altered sleeping 0 0 0  Tired, decreased energy 0 0 0  Change in appetite 0 0 0  Feeling bad or failure about yourself  0 0 0  Trouble concentrating 0 0 0  Moving slowly or fidgety/restless 0 0 0  Suicidal thoughts 0 0 0  PHQ-9 Score 0 0 0  Difficult doing work/chores Not difficult at all Not difficult at all Not difficult at all    BP Readings from Last 3 Encounters:  10/06/23 138/70  06/11/23 134/78  10/30/22 (!) 161/66    Physical Exam Vitals and nursing note reviewed.  Constitutional:      General: She is not in acute distress.    Appearance: She is well-developed.  HENT:     Head: Normocephalic and atraumatic.     Right Ear: Tympanic membrane and ear canal normal.     Left Ear: Tympanic membrane and ear canal normal.     Nose:     Right Sinus: No maxillary sinus tenderness.     Left Sinus: No maxillary sinus  tenderness.  Eyes:     General: No scleral icterus.       Right eye: No discharge.        Left eye: No discharge.     Conjunctiva/sclera: Conjunctivae normal.  Neck:     Thyroid: No thyromegaly.     Vascular: No carotid bruit.  Cardiovascular:     Rate and Rhythm: Normal rate and regular rhythm.     Pulses: Normal  pulses.     Heart sounds: Normal heart sounds.  Pulmonary:     Effort: Pulmonary effort is normal. No respiratory distress.     Breath sounds: No wheezing.  Abdominal:     General: Bowel sounds are normal.     Palpations: Abdomen is soft.     Tenderness: There is no abdominal tenderness.  Musculoskeletal:     Cervical back: Normal range of motion. No erythema.     Right lower leg: No edema.     Left lower leg: No edema.  Lymphadenopathy:     Cervical: No cervical adenopathy.  Skin:    General: Skin is warm and dry.     Findings: No rash.  Neurological:     Mental Status: She is alert and oriented to person, place, and time.     Cranial Nerves: No cranial nerve deficit.     Sensory: No sensory deficit.     Deep Tendon Reflexes: Reflexes are normal and symmetric.  Psychiatric:        Attention and Perception: Attention normal.        Mood and Affect: Mood normal.     Wt Readings from Last 3 Encounters:  10/06/23 167 lb 8 oz (76 kg)  06/11/23 163 lb 9.6 oz (74.2 kg)  10/03/22 146 lb (66.2 kg)    BP 138/70   Pulse 70   Ht 5\' 6"  (1.676 m)   Wt 167 lb 8 oz (76 kg)   SpO2 98%   BMI 27.04 kg/m   Assessment and Plan:  Problem List Items Addressed This Visit       Unprioritized   Hypothyroidism (acquired)   Supplemented. Lab Results  Component Value Date   TSH 3.320 06/11/2023         Relevant Orders   TSH + free T4   Mixed hyperlipidemia   10 yr risk is high - mostly due to age. Will recheck and advise on statin therapy      Relevant Orders   Lipid panel   Age-related osteoporosis without current pathological fracture   DEXA 2024 -  worsening osteopenia with osteoporosis of hip. Continue calcium and D Consider Fosamax therapy - she prefers not to take medications if possible. Will recheck DEXA next year.      Relevant Orders   Comprehensive metabolic panel with GFR   Stage 3a chronic kidney disease (CKD) (HCC)   Last GFR 56 - stable since at least 2022 Continue to avoid NSAIDS      Relevant Orders   CBC with Differential/Platelet   Comprehensive metabolic panel with GFR   Vitamin D deficiency   Continue supplementation      Relevant Orders   VITAMIN D 25 Hydroxy (Vit-D Deficiency, Fractures)   Other Visit Diagnoses       Annual physical exam    -  Primary   mammo done this month. immunizations are up to date.       Return in about 1 year (around 10/05/2024) for CPX.    Sheron Dixons, MD [facility] [place]

## 2023-10-06 NOTE — Assessment & Plan Note (Signed)
 Supplemented. Lab Results  Component Value Date   TSH 3.320 06/11/2023

## 2023-10-06 NOTE — Assessment & Plan Note (Signed)
 Continue supplementation  ?

## 2023-10-06 NOTE — Assessment & Plan Note (Addendum)
 DEXA 2024 - worsening osteopenia with osteoporosis of hip. Continue calcium and D Consider Fosamax therapy - she prefers not to take medications if possible. Will recheck DEXA next year.

## 2023-10-06 NOTE — Assessment & Plan Note (Signed)
 10 yr risk is high - mostly due to age. Will recheck and advise on statin therapy

## 2023-10-06 NOTE — Assessment & Plan Note (Signed)
 Last GFR 56 - stable since at least 2022 Continue to avoid NSAIDS

## 2023-10-07 ENCOUNTER — Encounter: Payer: Self-pay | Admitting: Internal Medicine

## 2023-10-07 LAB — CBC WITH DIFFERENTIAL/PLATELET
Basophils Absolute: 0.1 10*3/uL (ref 0.0–0.2)
Basos: 1 %
EOS (ABSOLUTE): 0.3 10*3/uL (ref 0.0–0.4)
Eos: 5 %
Hematocrit: 41.9 % (ref 34.0–46.6)
Hemoglobin: 14.1 g/dL (ref 11.1–15.9)
Immature Grans (Abs): 0 10*3/uL (ref 0.0–0.1)
Immature Granulocytes: 0 %
Lymphocytes Absolute: 2.4 10*3/uL (ref 0.7–3.1)
Lymphs: 39 %
MCH: 30.5 pg (ref 26.6–33.0)
MCHC: 33.7 g/dL (ref 31.5–35.7)
MCV: 91 fL (ref 79–97)
Monocytes Absolute: 0.6 10*3/uL (ref 0.1–0.9)
Monocytes: 9 %
Neutrophils Absolute: 2.8 10*3/uL (ref 1.4–7.0)
Neutrophils: 46 %
Platelets: 307 10*3/uL (ref 150–450)
RBC: 4.63 x10E6/uL (ref 3.77–5.28)
RDW: 12.9 % (ref 11.7–15.4)
WBC: 6.1 10*3/uL (ref 3.4–10.8)

## 2023-10-07 LAB — COMPREHENSIVE METABOLIC PANEL WITH GFR
ALT: 12 IU/L (ref 0–32)
AST: 19 IU/L (ref 0–40)
Albumin: 4.1 g/dL (ref 3.8–4.8)
Alkaline Phosphatase: 130 IU/L — ABNORMAL HIGH (ref 44–121)
BUN/Creatinine Ratio: 15 (ref 12–28)
BUN: 14 mg/dL (ref 8–27)
Bilirubin Total: 0.4 mg/dL (ref 0.0–1.2)
CO2: 19 mmol/L — ABNORMAL LOW (ref 20–29)
Calcium: 9.7 mg/dL (ref 8.7–10.3)
Chloride: 102 mmol/L (ref 96–106)
Creatinine, Ser: 0.91 mg/dL (ref 0.57–1.00)
Globulin, Total: 2.6 g/dL (ref 1.5–4.5)
Glucose: 89 mg/dL (ref 70–99)
Potassium: 4.9 mmol/L (ref 3.5–5.2)
Sodium: 137 mmol/L (ref 134–144)
Total Protein: 6.7 g/dL (ref 6.0–8.5)
eGFR: 65 mL/min/{1.73_m2} (ref >59)

## 2023-10-07 LAB — TSH+FREE T4
Free T4: 1.33 ng/dL (ref 0.82–1.77)
TSH: 4.55 u[IU]/mL — ABNORMAL HIGH (ref 0.450–4.500)

## 2023-10-07 LAB — LIPID PANEL
Chol/HDL Ratio: 4.1 ratio (ref 0.0–4.4)
Cholesterol, Total: 229 mg/dL — ABNORMAL HIGH (ref 100–199)
HDL: 56 mg/dL (ref >39)
LDL Chol Calc (NIH): 141 mg/dL — ABNORMAL HIGH (ref 0–99)
Triglycerides: 180 mg/dL — ABNORMAL HIGH (ref 0–149)
VLDL Cholesterol Cal: 32 mg/dL (ref 5–40)

## 2023-10-07 LAB — VITAMIN D 25 HYDROXY (VIT D DEFICIENCY, FRACTURES): Vit D, 25-Hydroxy: 33.2 ng/mL (ref 30.0–100.0)

## 2023-10-13 DIAGNOSIS — H43813 Vitreous degeneration, bilateral: Secondary | ICD-10-CM | POA: Diagnosis not present

## 2023-10-13 DIAGNOSIS — Z961 Presence of intraocular lens: Secondary | ICD-10-CM | POA: Diagnosis not present

## 2023-10-13 DIAGNOSIS — H353131 Nonexudative age-related macular degeneration, bilateral, early dry stage: Secondary | ICD-10-CM | POA: Diagnosis not present

## 2023-10-15 DIAGNOSIS — H5213 Myopia, bilateral: Secondary | ICD-10-CM | POA: Diagnosis not present

## 2023-11-24 ENCOUNTER — Telehealth: Payer: Self-pay | Admitting: Internal Medicine

## 2023-11-24 NOTE — Telephone Encounter (Signed)
 Copied from CRM [id number]. Topic: Medicare AWV >> Nov 24, 2023  1:36 PM Juliana Ocean wrote: Reason for CRM: LVM 11/25/2023 to schedule AWV. Please schedule Virtual or Telehealth visits ONLY.   Rosalee Collins; Care Guide Ambulatory Clinical Support [place] [facility] Direct Dial: [phone]

## 2023-12-04 ENCOUNTER — Encounter: Payer: Self-pay | Admitting: Internal Medicine

## 2023-12-04 NOTE — Telephone Encounter (Signed)
Please review patient's message:

## 2024-01-07 ENCOUNTER — Encounter: Payer: Self-pay | Admitting: Dermatology

## 2024-01-07 ENCOUNTER — Ambulatory Visit: Payer: Medicare (Managed Care) | Admitting: Dermatology

## 2024-01-07 DIAGNOSIS — C44311 Basal cell carcinoma of skin of nose: Secondary | ICD-10-CM

## 2024-01-07 DIAGNOSIS — D492 Neoplasm of unspecified behavior of bone, soft tissue, and skin: Secondary | ICD-10-CM

## 2024-01-07 DIAGNOSIS — C4491 Basal cell carcinoma of skin, unspecified: Secondary | ICD-10-CM

## 2024-01-07 HISTORY — DX: Basal cell carcinoma of skin, unspecified: C44.91

## 2024-01-07 NOTE — Progress Notes (Signed)
   Follow-Up Visit   Subjective  Krista Burns is a 78 y.o. female who presents for the following: place at L nose present x2-3 months, not healing. Patient states she has been picking the scab off, has been applying Aquaphor.   The following portions of the chart were reviewed this encounter and updated as appropriate: medications, allergies, medical history  Review of Systems:  No other skin or systemic complaints except as noted in HPI or Assessment and Plan.  Objective  Well appearing patient in no apparent distress; mood and affect are within normal limits.  A focused examination was performed of the following areas: Nose  Relevant exam findings are noted in the Assessment and Plan.  Left Tip of Nose 7 mm ulcerated pink papule    Assessment & Plan   NEOPLASM OF SKIN Left Tip of Nose Skin / nail biopsy Type of biopsy: tangential   Informed consent: discussed and consent obtained   Timeout: patient name, date of birth, surgical site, and procedure verified   Procedure prep:  Patient was prepped and draped in usual sterile fashion Prep type:  Isopropyl alcohol Anesthesia: the lesion was anesthetized in a standard fashion   Anesthetic:  1% lidocaine w/ epinephrine 1-100,000 buffered w/ 8.4% NaHCO3 Instrument used: DermaBlade   Hemostasis achieved with: pressure and aluminum chloride   Outcome: patient tolerated procedure well   Post-procedure details: sterile dressing applied and wound care instructions given   Dressing type: bandage and petrolatum    Specimen 1 - Surgical pathology Differential Diagnosis: BCC  Check Margins: No 7 mm ulcerated pink papule  Return for PRN pending biopsy results .  I, Jacquelynn V. Wilfred, CMA, am acting as scribe for Boneta Sharps, MD .   Documentation: I have reviewed the above documentation for accuracy and completeness, and I agree with the above.  Boneta Sharps, MD

## 2024-01-07 NOTE — Patient Instructions (Addendum)

## 2024-01-08 ENCOUNTER — Ambulatory Visit: Payer: Self-pay | Admitting: Dermatology

## 2024-01-08 DIAGNOSIS — C44311 Basal cell carcinoma of skin of nose: Secondary | ICD-10-CM

## 2024-01-08 LAB — SURGICAL PATHOLOGY

## 2024-01-11 NOTE — Telephone Encounter (Signed)
 Discussed pathology results and treatment plan. Patient voiced understanding. Prefers [facility] for Mohs. Referral sent to [facility].

## 2024-01-11 NOTE — Telephone Encounter (Signed)
-----   Message from [place] sent at 01/08/2024  9:26 PM EDT ----- Diagnosis: left tip of nose :       BASAL CELL CARCINOMA, NODULAR AND INFILTRATIVE PATTERNS   Please call with diagnosis and determine where the patient would like to have Mohs surgery.  Explanation: your biopsy shows a basal cell skin cancer in the second layer of the skin. This is the most common kind of skin cancer and is caused by damage from sun exposure. Basal cell skin cancers  almost never spread beyond the skin, so they are not dangerous to your overall health. However, they will continue to grow, can bleed, cause nonhealing wounds, and disrupt nearby structures unless  fully treated.  Treatment: Given the location and type of skin cancer, I recommend Mohs surgery. Mohs surgery involves cutting out the skin cancer and then checking under the microscope to ensure the whole skin  cancer was removed. If any skin cancer remains, the surgeon will cut out more until it is fully removed. The cure rate is about 98-99%. Once the Mohs surgeon confirms the skin cancer is out, they  will discuss the options to repair or heal the area. You must take it easy for about two weeks after surgery (no lifting over 10-15 lbs, avoid activity to get your heart rate and blood pressure up).  It is done at another office outside of [place] ([place], [place], or [place]). ----- Message ----- From: Interface, Lab In [place] Sent: 01/08/2024   6:07 PM EDT To: Boneta Sharps, MD

## 2024-02-27 ENCOUNTER — Other Ambulatory Visit: Payer: Self-pay | Admitting: Internal Medicine

## 2024-02-27 DIAGNOSIS — E039 Hypothyroidism, unspecified: Secondary | ICD-10-CM

## 2024-02-29 DIAGNOSIS — C44311 Basal cell carcinoma of skin of nose: Secondary | ICD-10-CM | POA: Diagnosis not present

## 2024-02-29 DIAGNOSIS — L814 Other melanin hyperpigmentation: Secondary | ICD-10-CM | POA: Diagnosis not present

## 2024-02-29 DIAGNOSIS — L578 Other skin changes due to chronic exposure to nonionizing radiation: Secondary | ICD-10-CM | POA: Diagnosis not present

## 2024-02-29 NOTE — Telephone Encounter (Signed)
 Requested medications are due for refill today.  yes  Requested medications are on the active medications list.  yes  Last refill. 09/09/2023 #90 1 rf  Future visit scheduled.   no  Notes to clinic.  Abnormal labs.    Requested Prescriptions  Pending Prescriptions Disp Refills   levothyroxine  (SYNTHROID ) 75 MCG tablet [Pharmacy Med Name: Levothyroxine  Sodium 75 MCG Oral Tablet] 90 tablet 0    Sig: TAKE 1 TABLET BY MOUTH ONCE DAILY BEFORE BREAKFAST     Endocrinology:  Hypothyroid Agents Failed - 02/29/2024 11:57 AM      Failed - TSH in normal range and within 360 days    TSH  Date Value Ref Range Status  10/06/2023 4.550 (H) 0.450 - 4.500 uIU/mL Final         Passed - Valid encounter within last 12 months    Recent Outpatient Visits           4 months ago Annual physical exam   [facility] at [facility], Leita DEL, MD

## 2024-03-28 ENCOUNTER — Other Ambulatory Visit: Payer: Self-pay | Admitting: Internal Medicine

## 2024-03-28 DIAGNOSIS — E039 Hypothyroidism, unspecified: Secondary | ICD-10-CM

## 2024-03-29 NOTE — Telephone Encounter (Signed)
 Requested medications are due for refill today.  yes  Requested medications are on the active medications list.  yes  Last refill. 02/29/2024 #30 0 rf  Future visit scheduled.   no  Notes to clinic.  Abnormal labs    Requested Prescriptions  Pending Prescriptions Disp Refills   levothyroxine  (SYNTHROID ) 75 MCG tablet [Pharmacy Med Name: Levothyroxine  Sodium 75 MCG Oral Tablet] 30 tablet 0    Sig: TAKE 1 TABLET BY MOUTH ONCE DAILY BEFORE BREAKFAST     Endocrinology:  Hypothyroid Agents Failed - 03/29/2024  1:46 PM      Failed - TSH in normal range and within 360 days    TSH  Date Value Ref Range Status  10/06/2023 4.550 (H) 0.450 - 4.500 uIU/mL Final         Passed - Valid encounter within last 12 months    Recent Outpatient Visits           5 months ago Annual physical exam   [facility] at [facility], Leita DEL, MD

## 2024-04-26 ENCOUNTER — Other Ambulatory Visit: Payer: Self-pay | Admitting: Internal Medicine

## 2024-04-26 DIAGNOSIS — E039 Hypothyroidism, unspecified: Secondary | ICD-10-CM

## 2024-04-27 NOTE — Telephone Encounter (Signed)
 Requested Prescriptions  Pending Prescriptions Disp Refills   levothyroxine  (SYNTHROID ) 75 MCG tablet [Pharmacy Med Name: Levothyroxine  Sodium 75 MCG Oral Tablet] 90 tablet 0    Sig: TAKE 1 TABLET BY MOUTH ONCE DAILY BEFORE BREAKFAST     Endocrinology:  Hypothyroid Agents Failed - 04/27/2024 12:33 PM      Failed - TSH in normal range and within 360 days    TSH  Date Value Ref Range Status  10/06/2023 4.550 (H) 0.450 - 4.500 uIU/mL Final         Passed - Valid encounter within last 12 months    Recent Outpatient Visits           6 months ago Annual physical exam   [facility] at [facility], Leita DEL, MD

## 2024-04-28 ENCOUNTER — Telehealth: Payer: Self-pay | Admitting: Internal Medicine

## 2024-04-28 NOTE — Telephone Encounter (Signed)
 Copied from CRM #[id number]. Topic: Medicare AWV >> Apr 28, 2024 10:15 AM Nathanel DEL wrote: Reason for CRM: Called LVM 04/27/2024 to schedule AWV. Please schedule office or virtual visits  Nathanel Paschal; Care Guide Ambulatory Clinical Support [place] [facility] Direct Dial: [phone]

## 2024-05-30 ENCOUNTER — Telehealth: Payer: Self-pay

## 2024-05-30 NOTE — Telephone Encounter (Signed)
 Please see [facility] Mohs procedure scanned under media. Thank you.

## 2024-07-22 ENCOUNTER — Other Ambulatory Visit: Payer: Self-pay

## 2024-07-22 DIAGNOSIS — E039 Hypothyroidism, unspecified: Secondary | ICD-10-CM

## 2024-07-22 MED ORDER — LEVOTHYROXINE SODIUM 75 MCG PO TABS: 75.0000 ug | ORAL_TABLET | Freq: Every day | ORAL | 0 refills | Status: AC

## 2024-10-12 ENCOUNTER — Encounter: Payer: Medicare (Managed Care) | Admitting: Student
